# Patient Record
Sex: Male | Born: 1935 | Race: White | Hispanic: No | Marital: Single | Smoking: Never smoker
Health system: Southern US, Community
[De-identification: ages and names within clinical notes are randomized; demographics above are authoritative.]

## PROBLEM LIST (undated history)

## (undated) DIAGNOSIS — C4491 Basal cell carcinoma of skin, unspecified: Secondary | ICD-10-CM

## (undated) DIAGNOSIS — D649 Anemia, unspecified: Secondary | ICD-10-CM

## (undated) DIAGNOSIS — M47816 Spondylosis without myelopathy or radiculopathy, lumbar region: Secondary | ICD-10-CM

## (undated) DIAGNOSIS — E785 Hyperlipidemia, unspecified: Secondary | ICD-10-CM

## (undated) DIAGNOSIS — I10 Essential (primary) hypertension: Secondary | ICD-10-CM

## (undated) DIAGNOSIS — Z87891 Personal history of nicotine dependence: Secondary | ICD-10-CM

## (undated) HISTORY — PX: AORTIC VALVE REPLACEMENT: SHX41

---

## 2018-05-31 ENCOUNTER — Encounter (HOSPITAL_COMMUNITY): Payer: Self-pay

## 2018-05-31 ENCOUNTER — Inpatient Hospital Stay (HOSPITAL_COMMUNITY)
Admission: EM | Admit: 2018-05-31 | Discharge: 2018-06-02 | DRG: 178 | Disposition: A | Discharge status: Home Health | Payer: Medicare Other | Source: Skilled Nursing Facility | Attending: Internal Medicine | Admitting: Internal Medicine

## 2018-05-31 ENCOUNTER — Other Ambulatory Visit: Payer: Self-pay

## 2018-05-31 ENCOUNTER — Emergency Department (HOSPITAL_COMMUNITY): Payer: Medicare Other

## 2018-05-31 DIAGNOSIS — Z791 Long term (current) use of non-steroidal anti-inflammatories (NSAID): Secondary | ICD-10-CM

## 2018-05-31 DIAGNOSIS — Z952 Presence of prosthetic heart valve: Secondary | ICD-10-CM

## 2018-05-31 DIAGNOSIS — Z8249 Family history of ischemic heart disease and other diseases of the circulatory system: Secondary | ICD-10-CM

## 2018-05-31 DIAGNOSIS — Z79899 Other long term (current) drug therapy: Secondary | ICD-10-CM

## 2018-05-31 DIAGNOSIS — J690 Pneumonitis due to inhalation of food and vomit: Secondary | ICD-10-CM | POA: Diagnosis not present

## 2018-05-31 DIAGNOSIS — K5900 Constipation, unspecified: Secondary | ICD-10-CM

## 2018-05-31 DIAGNOSIS — F1020 Alcohol dependence, uncomplicated: Secondary | ICD-10-CM | POA: Diagnosis present

## 2018-05-31 DIAGNOSIS — Z7952 Long term (current) use of systemic steroids: Secondary | ICD-10-CM

## 2018-05-31 DIAGNOSIS — I959 Hypotension, unspecified: Secondary | ICD-10-CM | POA: Diagnosis present

## 2018-05-31 DIAGNOSIS — I10 Essential (primary) hypertension: Secondary | ICD-10-CM

## 2018-05-31 DIAGNOSIS — I440 Atrioventricular block, first degree: Secondary | ICD-10-CM

## 2018-05-31 DIAGNOSIS — E785 Hyperlipidemia, unspecified: Secondary | ICD-10-CM

## 2018-05-31 DIAGNOSIS — Z66 Do not resuscitate: Secondary | ICD-10-CM

## 2018-05-31 DIAGNOSIS — K219 Gastro-esophageal reflux disease without esophagitis: Secondary | ICD-10-CM

## 2018-05-31 DIAGNOSIS — K921 Melena: Secondary | ICD-10-CM | POA: Diagnosis not present

## 2018-05-31 DIAGNOSIS — Z87891 Personal history of nicotine dependence: Secondary | ICD-10-CM

## 2018-05-31 DIAGNOSIS — Z7289 Other problems related to lifestyle: Secondary | ICD-10-CM

## 2018-05-31 DIAGNOSIS — K2970 Gastritis, unspecified, without bleeding: Secondary | ICD-10-CM | POA: Diagnosis present

## 2018-05-31 DIAGNOSIS — J189 Pneumonia, unspecified organism: Secondary | ICD-10-CM | POA: Diagnosis present

## 2018-05-31 DIAGNOSIS — J181 Lobar pneumonia, unspecified organism: Secondary | ICD-10-CM | POA: Diagnosis not present

## 2018-05-31 DIAGNOSIS — D649 Anemia, unspecified: Secondary | ICD-10-CM

## 2018-05-31 DIAGNOSIS — Z7982 Long term (current) use of aspirin: Secondary | ICD-10-CM

## 2018-05-31 HISTORY — DX: Personal history of nicotine dependence: Z87.891

## 2018-05-31 LAB — BASIC METABOLIC PANEL
Anion gap: 9 (ref 5–15)
BUN: 40 mg/dL — ABNORMAL HIGH (ref 8–23)
CHLORIDE: 109 mmol/L (ref 98–111)
CO2: 20 mmol/L — AB (ref 22–32)
CREATININE: 0.86 mg/dL (ref 0.61–1.24)
Calcium: 7.2 mg/dL — ABNORMAL LOW (ref 8.9–10.3)
GFR calc non Af Amer: >60 mL/min (ref >60)
Glucose, Bld: 98 mg/dL (ref 70–99)
Potassium: 3.9 mmol/L (ref 3.5–5.1)
Sodium: 138 mmol/L (ref 135–145)

## 2018-05-31 LAB — CBC WITH DIFFERENTIAL/PLATELET
ABS IMMATURE GRANULOCYTES: 0.1 10*3/uL (ref 0.0–0.1)
Basophils Absolute: 0 10*3/uL (ref 0.0–0.1)
Basophils Relative: 0 %
Eosinophils Absolute: 0.1 10*3/uL (ref 0.0–0.7)
Eosinophils Relative: 1 %
HEMATOCRIT: 37.6 % — AB (ref 39.0–52.0)
HEMOGLOBIN: 12.6 g/dL — AB (ref 13.0–17.0)
IMMATURE GRANULOCYTES: 1 %
LYMPHS ABS: 1.4 10*3/uL (ref 0.7–4.0)
Lymphocytes Relative: 12 %
MCH: 32.4 pg (ref 26.0–34.0)
MCHC: 33.5 g/dL (ref 30.0–36.0)
MCV: 96.7 fL (ref 78.0–100.0)
MONOS PCT: 13 %
Monocytes Absolute: 1.5 10*3/uL — ABNORMAL HIGH (ref 0.1–1.0)
NEUTROS PCT: 73 %
Neutro Abs: 8.5 10*3/uL — ABNORMAL HIGH (ref 1.7–7.7)
Platelets: 239 10*3/uL (ref 150–400)
RBC: 3.89 MIL/uL — ABNORMAL LOW (ref 4.22–5.81)
RDW: 12.7 % (ref 11.5–15.5)
WBC: 11.5 10*3/uL — ABNORMAL HIGH (ref 4.0–10.5)

## 2018-05-31 LAB — URINALYSIS, ROUTINE W REFLEX MICROSCOPIC
Bilirubin Urine: NEGATIVE
Glucose, UA: NEGATIVE mg/dL
Hgb urine dipstick: NEGATIVE
Ketones, ur: NEGATIVE mg/dL
LEUKOCYTES UA: NEGATIVE
NITRITE: NEGATIVE
PH: 5 (ref 5.0–8.0)
Protein, ur: NEGATIVE mg/dL
SPECIFIC GRAVITY, URINE: 1.023 (ref 1.005–1.030)

## 2018-05-31 LAB — I-STAT CG4 LACTIC ACID, ED: Lactic Acid, Venous: 1.74 mmol/L (ref 0.5–1.9)

## 2018-05-31 MED ORDER — POLYETHYLENE GLYCOL 3350 17 G PO PACK: 17.0000 g | PACK | Freq: Every day | ORAL | Status: DC | PRN

## 2018-05-31 MED ORDER — GUAIFENESIN ER 600 MG PO TB12
600.0000 mg | ORAL_TABLET | Freq: Two times a day (BID) | ORAL | Status: DC
  Administered 2018-05-31 – 2018-06-02 (×4): 600 mg via ORAL
  Filled 2018-05-31 (×4): qty 1

## 2018-05-31 MED ORDER — SODIUM CHLORIDE 0.9 % IV BOLUS (SEPSIS)
1000.0000 mL | Freq: Once | INTRAVENOUS | Status: AC
  Administered 2018-05-31: 1000 mL via INTRAVENOUS

## 2018-05-31 MED ORDER — VITAMIN B-1 100 MG PO TABS
100.0000 mg | ORAL_TABLET | Freq: Every day | ORAL | Status: DC
  Administered 2018-05-31 – 2018-06-02 (×3): 100 mg via ORAL
  Filled 2018-05-31 (×3): qty 1

## 2018-05-31 MED ORDER — FENOFIBRATE 160 MG PO TABS
160.0000 mg | ORAL_TABLET | Freq: Every day | ORAL | Status: DC
  Administered 2018-05-31 – 2018-06-02 (×3): 160 mg via ORAL
  Filled 2018-05-31 (×3): qty 1

## 2018-05-31 MED ORDER — SODIUM CHLORIDE 0.9 % IV SOLN
2.0000 g | INTRAVENOUS | Status: DC
  Administered 2018-05-31: 2 g via INTRAVENOUS
  Filled 2018-05-31: qty 20

## 2018-05-31 MED ORDER — ENOXAPARIN SODIUM 40 MG/0.4ML ~~LOC~~ SOLN: 40.0000 mg | SUBCUTANEOUS | Status: DC

## 2018-05-31 MED ORDER — ACETAMINOPHEN 325 MG PO TABS: 650.0000 mg | ORAL_TABLET | Freq: Four times a day (QID) | ORAL | Status: DC | PRN

## 2018-05-31 MED ORDER — SODIUM CHLORIDE 0.9 % IV BOLUS (SEPSIS)
1000.0000 mL | Freq: Once | INTRAVENOUS | Status: AC
Start: 2018-05-31 — End: 2018-05-31
  Administered 2018-05-31: 1000 mL via INTRAVENOUS

## 2018-05-31 MED ORDER — ACETAMINOPHEN 650 MG RE SUPP: 650.0000 mg | Freq: Four times a day (QID) | RECTAL | Status: DC | PRN

## 2018-05-31 MED ORDER — METHOCARBAMOL 500 MG PO TABS: 500.0000 mg | ORAL_TABLET | Freq: Two times a day (BID) | ORAL | Status: DC | PRN

## 2018-05-31 MED ORDER — SODIUM CHLORIDE 0.9 % IV SOLN
3.0000 g | Freq: Four times a day (QID) | INTRAVENOUS | Status: DC
  Administered 2018-05-31 – 2018-06-02 (×8): 3 g via INTRAVENOUS
  Filled 2018-05-31 (×10): qty 3

## 2018-05-31 MED ORDER — PANTOPRAZOLE SODIUM 40 MG PO TBEC
40.0000 mg | DELAYED_RELEASE_TABLET | Freq: Every day | ORAL | Status: DC
  Administered 2018-05-31 – 2018-06-02 (×3): 40 mg via ORAL
  Filled 2018-05-31 (×3): qty 1

## 2018-05-31 MED ORDER — ALBUTEROL SULFATE (2.5 MG/3ML) 0.083% IN NEBU: 2.5000 mg | INHALATION_SOLUTION | RESPIRATORY_TRACT | Status: DC | PRN

## 2018-05-31 MED ORDER — IPRATROPIUM-ALBUTEROL 0.5-2.5 (3) MG/3ML IN SOLN
3.0000 mL | Freq: Once | RESPIRATORY_TRACT | Status: AC
  Administered 2018-05-31: 3 mL via RESPIRATORY_TRACT
  Filled 2018-05-31: qty 3

## 2018-05-31 MED ORDER — SODIUM CHLORIDE 0.9 % IV SOLN
500.0000 mg | INTRAVENOUS | Status: DC
  Administered 2018-05-31 – 2018-06-01 (×2): 500 mg via INTRAVENOUS
  Filled 2018-05-31 (×3): qty 500

## 2018-05-31 MED ORDER — FAMOTIDINE IN NACL 20-0.9 MG/50ML-% IV SOLN
20.0000 mg | Freq: Two times a day (BID) | INTRAVENOUS | Status: DC
  Administered 2018-05-31 – 2018-06-01 (×3): 20 mg via INTRAVENOUS
  Filled 2018-05-31 (×3): qty 50

## 2018-05-31 NOTE — ED Notes (Signed)
Patient transported to X-ray 

## 2018-05-31 NOTE — H&P (Signed)
Date: 05/31/2018               Patient Name:  Johnny Drake MRN: [id number]  DOB: [birth year] Age / Sex: 84 y.o., male   PCP: Linus Mako, NP         Medical Service: Internal Medicine Teaching Service         Attending Physician: Dr. Bartholomew Crews, MD    First Contact: Dr. Eileen Stanford Pager: [phone]  Second Contact: Dr. Jari Favre Pager: [phone]       After Hours (After 5p/  First Contact Pager: [phone]  weekends / holidays): Second Contact Pager: [phone]   Chief Complaint: Weakness, Cough   History of Present Illness:   Mr. Johnny Drake is an 84 year old Caucasian male with past medical history significant for GERD, hypertension, anemia, aortic valve replacement and hyperlipidemia who presented to the emergency department via EMS with weakness and cough.  He is a resident of an assisted living facility [facility]) and  was in his usual state of health until about 1 week ago when he visited his PCP (Dr. Earnest Bailey) for six-month follow-up.  At that visit, he was found to have abnormal auscultation on lung exams and was encouraged to get a checks x-ray.  He states that his chest x-ray revealed " bilateral pneumonia" and was started on doxycycline and a breathing treatment.  In the interim, he had been experiencing cough productive of yellow sputum but denied fevers, chills or shortness of breath.  3 days ago, his symptoms were not improving so patient called his clinic and was asked to follow-up this morning.  This morning as patient was walking to the clinic, he felt weak, off-balance and required about 50 minutes to get to the clinic but it usually takes him 10 minutes.  He did not endorse lightheadedness, dizziness, palpitation, dysrhythmia or shortness of breath.  While at the clinic, EMS was called and patient was subsequently brought to the ED.  He also complains of a 1 day history of severe heartburn as he has not received his antacid it for about a week.  This has since  resolved.  He reports of intermittent gagging for the past 3 to 4 days but denies vomiting, aspiration or dysphagia.  Patient also reports of a one-time episode of melena this morning and denies any previous episodes. He also denies hematochezia, hematemesis, bright red blood per rectum but endorses constipation.  ED course. EMS vitals BP 98/50, HR 110, CBG 97.  Received 500 bolus NS and Zofran 4 mg. On arrival to [facility], ED BP was stable, tachypneic with range 14-23, ECG pulse unremarkable, oxygen/saturation 99% on room air.  CBC showed mild leukocytosis of 11.5, hemoglobin of 12.6, normal MCV, BUN of 40 and normal lactic acid.  He received 3 L NS bolus and was started on ceftriaxone and azithromycin.  Meds:  Current Meds  Medication Sig  . albuterol (PROVENTIL HFA;VENTOLIN HFA) 108 (90 Base) MCG/ACT inhaler Inhale 2 puffs into the lungs every 6 (six) hours as needed for wheezing or shortness of breath.  Marland Kitchen aspirin EC 81 MG tablet Take 81 mg by mouth daily.  . celecoxib (CELEBREX) 200 MG capsule Take 200 mg by mouth daily.  Marland Kitchen doxycycline (VIBRAMYCIN) 100 MG capsule Take 100 mg by mouth 2 (two) times daily. For 10 days  . fenofibrate (TRICOR) 145 MG tablet Take 145 mg by mouth daily.  . folic acid (FOLVITE) 1 MG tablet Take 1 mg by mouth daily.  Marland Kitchen  guaiFENesin (MUCINEX) 600 MG 12 hr tablet Take 600 mg by mouth every 12 (twelve) hours. For 7 days  . hydrochlorothiazide (HYDRODIURIL) 12.5 MG tablet Take 12.5 mg by mouth daily.  Marland Kitchen losartan (COZAAR) 100 MG tablet Take 100 mg by mouth daily.  . methocarbamol (ROBAXIN) 500 MG tablet Take 500 mg by mouth 2 (two) times daily as needed for muscle spasms.  . metoprolol succinate (TOPROL-XL) 25 MG 24 hr tablet Take 25 mg by mouth every other day.  . predniSONE (DELTASONE) 20 MG tablet Take 20 mg by mouth See admin instructions. Take 40mg  for 3 days then 20mg  for 4 days  . thiamine 100 MG tablet Take 100 mg by mouth daily.     Allergies: Allergies  as of 05/31/2018  . (No Known Allergies)   No past medical history on file.  Family History:  Mother: Deceased at 67 from alcohol poisoning Father: Hypertension  Social History:  -Use tobacco products from age [age], smoked 1/2 pack/day -Current alcohol use.  Drinks a glass (2 shots) vodka each night -States he has been active and healthy most of his life and was a Insurance underwriter for the [employer] -Has completed in triathlons, marathons and has a competition coming up next month.  Review of Systems: A complete ROS was negative except as per HPI.   Physical Exam: Blood pressure 137/88, pulse 70, temperature 98 F (36.7 C), temperature source Oral, resp. rate 17, height 5\' 8"  (1.727 m), weight 185 lb (83.9 kg), SpO2 97 %.  Physical exam Constitutional: In no acute distress, pleasantly lying in bed, appears her stated age 63: Atraumatic, normocephalic Eyes: nonicteric Cardiovascular: Normal S1, S2.  No abnormal heart sounds appreciated Respiratory: CTA BL with the exception of crackles appreciated at the left lower lobe posteriorly and bilateral apices anteriorly  Abdomen: Soft, distended, nontender to palpation Extremities: Dorsalis pedis palpable, no lower extremity edema Neuro: alert and oriented Psychiatry: Mood and affect normal Skin: Well-healed surgical scar on the right upper chest.   EKG: personally reviewed my interpretation is sinus rhythm, low voltage.   CXR: personally reviewed my interpretation is bilateral apical consolidation.   Assessment & Plan by Problem: Active Problems:   Pneumonia  Mr. Johnny Drake is an 84 year old Caucasian male with past medical history significant for GERD, hypertension, anemia, aortic valve replacement, hyperlipidemia and EtOH use currently being treated for pneumonia.  Pneumonia Aspiration pneumonia vs community-acquired pneumonia 1 week history of cough productive of yellow sputum and previously started on doxycycline by her PCP.   Has remained afebrile since onset of symptoms.  Presented to ED with weakness, hypotension. CBC positive for mild leukocytosis of 11.5, lactic acid normal at 1.74, chest x-ray positive for bilateral apical consolidation. Physical exams noticeable for crackles at the left lower lobe posteriorly and bilateral apices anteriorly.  Patient resides at an assisted living facility which puts him at risk for community acquired pneumonia.  Also given his history of EtOH use and recent episodes of gagging, he is at risk for aspiration pneumonia. -Discontinue Zosyn -Start Unasyn 3g -Continue Azithromycin 500mg  -F/u AM CBC  -F/u blood culture -On Mucinex, DuoNeb, albuterol nebulizer   Upper GI bleed, asymptomatic  Gastritis vs peptic ulcer disease Anemia, asymptomatic One episode of melena this morning. Chronically on NSAIDs (Celocoxib) and likely to develop gastritis or peptic ulcer disease.  No signs of anemic symptomology such as lightheadedness, dizziness, chest pain, palpitation.  Hemoglobin on admission 12.6 -F/u morning H/H -F/u FOBT -Hold ASA -Hold Celecoxib  GERD Reports that he has been out of antacid for 1 week and last night experienced severe heartburn.  Currently asymptomatic -Start IV Pepcid 20 mg    Hypertension BP currently stable -Will hold home antihypertensives  -Losartan 100 mg daily, metoprolol 25 mg every other day, hydrochlorothiazide 12.5 mg daily   Hyperlipidemia -Takes fenofibrate 145 mg p.o. daily   ETOH Use  Drinks a glass of vodka each night.  No previous history of EtOH withdrawal or seizures -Start Thiamine 100 mg daily   FEN: Replace lytes as needed, thin liquid diet DVT prophylaxis: Lovenox 40 mg SQ CODE STATUS: DNR   Dispo: Admit patient to observation with expected length of stay less than 2 nights   Signed: Jean Rosenthal, MD 05/31/2018, 7:08 PM  Pager: [phone]

## 2018-05-31 NOTE — ED Provider Notes (Signed)
[facility] EMERGENCY DEPARTMENT Provider Note   CSN: [id number] Arrival date & time: 05/31/18  1433     History   Chief Complaint Chief Complaint  Patient presents with  . Shortness of Breath  . Weakness  . Hypotension    HPI Johnny Drake is a 84 y.o. male with a history of GERD, HTN, anemia, aortic valve replacement, and HLD who presents with weakness.   Patient reports that he was seen 1 week ago by the doctors at the [facility] for a routine 6 month follow-up. He states that during that visit the doctor noticed some abnormal chest findings on physical exam and ordered a CXR that ultimately showed a "bilateral pneumonia." He says that during that time he did have some cough and congestion but no SOB. He was started on doxycycline and albuterol. He states that his cough and congestion remained constant throughout the week.  Last night he had severe centrally-located non-radiating chest pain that he associated with his acid reflux. He states that due to an error at the assisted living facility he was unable to get his antacid medication. His chest pain improved throughout the night and into this morning. He decided to be seen at the clinic to get a refill of his antacid medication. He reports generalized weakness while walking to his clinic appointment stating "it took me 50 minutes to walk to the clinic when it usually only takes me 10 minutes." He says that he had to hold on to the guard rails to stay up and that when he finally got to the clinic he needed to sit down immediately. The clinic staff called EMS and he was noted to have VS 98/50, HR 110, CBG 97. EMS gave given 500 bolus and 4mg  Zofran.  Patient does not currently endorse SOB or chest pain. Patient does continue to endorse cough and congestion. He denies abdominal pain and fever. He does note several days of urgency but denies dysuria and hematuria.    No past medical history on  file.  Patient Active Problem List   Diagnosis Date Noted  . Pneumonia 05/31/2018       Home Medications    Prior to Admission medications   Medication Sig Start Date End Date Taking? Authorizing Provider  albuterol (PROVENTIL HFA;VENTOLIN HFA) 108 (90 Base) MCG/ACT inhaler Inhale 2 puffs into the lungs every 6 (six) hours as needed for wheezing or shortness of breath.   Yes [provider]  aspirin EC 81 MG tablet Take 81 mg by mouth daily.   Yes [provider]  celecoxib (CELEBREX) 200 MG capsule Take 200 mg by mouth daily.   Yes [provider]  doxycycline (VIBRAMYCIN) 100 MG capsule Take 100 mg by mouth 2 (two) times daily. For 10 days   Yes [provider]  fenofibrate (TRICOR) 145 MG tablet Take 145 mg by mouth daily.   Yes [provider]  folic acid (FOLVITE) 1 MG tablet Take 1 mg by mouth daily.   Yes [provider]  guaiFENesin (MUCINEX) 600 MG 12 hr tablet Take 600 mg by mouth every 12 (twelve) hours. For 7 days   Yes [provider]  hydrochlorothiazide (HYDRODIURIL) 12.5 MG tablet Take 12.5 mg by mouth daily.   Yes [provider]  losartan (COZAAR) 100 MG tablet Take 100 mg by mouth daily.   Yes [provider]  methocarbamol (ROBAXIN) 500 MG tablet Take 500 mg by mouth 2 (two) times  daily as needed for muscle spasms.   Yes [provider]  metoprolol succinate (TOPROL-XL) 25 MG 24 hr tablet Take 25 mg by mouth every other day.   Yes [provider]  predniSONE (DELTASONE) 20 MG tablet Take 20 mg by mouth See admin instructions. Take 40mg  for 3 days then 20mg  for 4 days   Yes [provider]  thiamine 100 MG tablet Take 100 mg by mouth daily.   Yes [provider]    Family History No family history on file.  Social History Social History   Tobacco Use  . Smoking status: Not on file  Substance Use Topics  . Alcohol use: Not on file  . Drug use:  Not on file     Allergies   Patient has no allergy information on record.   Review of Systems Review of Systems  Constitutional: Negative for chills, diaphoresis and fever.  HENT: Positive for congestion. Negative for rhinorrhea and sore throat.   Eyes: Negative.   Respiratory: Positive for cough and shortness of breath. Negative for chest tightness and wheezing.   Cardiovascular: Positive for chest pain. Negative for palpitations and leg swelling.  Gastrointestinal: Negative for abdominal distention, abdominal pain, constipation, diarrhea, nausea and vomiting.  Genitourinary: Positive for urgency. Negative for difficulty urinating and hematuria.  Neurological: Positive for weakness. Negative for dizziness, light-headedness, numbness and headaches.  Psychiatric/Behavioral: Negative.   All other systems reviewed and are negative.    Physical Exam Updated Vital Signs BP (!) 124/57 (BP Location: Left Arm)   Pulse 61   Temp 97.6 F (36.4 C) (Oral)   Resp (!) 28   Ht 5\' 8"  (1.727 m)   Wt 83.9 kg (185 lb)   SpO2 100%   BMI 28.13 kg/m   Physical Exam  Constitutional: He appears well-developed and well-nourished.  HENT:  Head: Normocephalic and atraumatic.  Eyes: EOM are normal.  Cardiovascular: Normal rate and regular rhythm.  Pulmonary/Chest: Effort normal.  Crackles at lung bases bilaterally.  Abdominal: Soft. Bowel sounds are normal.  Musculoskeletal:       Right lower leg: Normal.       Left lower leg: Normal.  Neurological: He is alert.  Skin: Skin is warm and dry.  Psychiatric: He has a normal mood and affect. His behavior is normal.  Nursing note and vitals reviewed.    ED Treatments / Results  Labs (all labs ordered are listed, but only abnormal results are displayed) Labs Reviewed  BASIC METABOLIC PANEL - Abnormal; Notable for the following components:      Result Value   CO2 20 (*)    BUN 40 (*)    Calcium 7.2 (*)    All other components within  normal limits  CBC WITH DIFFERENTIAL/PLATELET - Abnormal; Notable for the following components:   WBC 11.5 (*)    RBC 3.89 (*)    Hemoglobin 12.6 (*)    HCT 37.6 (*)    Neutro Abs 8.5 (*)    Monocytes Absolute 1.5 (*)    All other components within normal limits  CULTURE, BLOOD (ROUTINE X 2)  CULTURE, BLOOD (ROUTINE X 2)  URINALYSIS, ROUTINE W REFLEX MICROSCOPIC  COMPREHENSIVE METABOLIC PANEL  CBC  OCCULT BLOOD X 1 CARD TO LAB, STOOL  I-STAT CG4 LACTIC ACID, ED    EKG EKG Interpretation  Date/Time:  Monday May 31 2018 14:46:57 EDT Ventricular Rate:  80 PR Interval:    QRS Duration: 82 QT Interval:  384 QTC Calculation:  443 R Axis:   1 Text Interpretation:  Sinus rhythm Prolonged PR interval Low voltage, precordial leads Probable anteroseptal infarct, old No old tracing to compare Confirmed by Isla Pence [phone]) on 05/31/2018 2:49:23 PM   Radiology Dg Chest 2 View  Result Date: 05/31/2018 CLINICAL DATA:  Shortness of breath, history of pneumonia EXAM: CHEST - 2 VIEW COMPARISON:  None. FINDINGS: Cardiac shadows within normal limits. Postsurgical changes are seen. Lungs are well aerated bilaterally. Patchy changes in the right apex are noted consistent with focal infiltrate. No sizable effusion is noted. No bony abnormality is seen. IMPRESSION: Increased right apical changes consistent with pneumonia. Electronically Signed   By: Inez Catalina M.D.   On: 05/31/2018 16:28    Procedures Procedures (including critical care time)  Medications Ordered in ED Medications  azithromycin (ZITHROMAX) 500 mg in sodium chloride 0.9 % 250 mL IVPB (0 mg Intravenous Stopped 05/31/18 1710)  fenofibrate tablet 160 mg (has no administration in time range)  guaiFENesin (MUCINEX) 12 hr tablet 600 mg (has no administration in time range)  methocarbamol (ROBAXIN) tablet 500 mg (has no administration in time range)  thiamine tablet 100 mg (has no administration in time range)  pantoprazole  (PROTONIX) EC tablet 40 mg (has no administration in time range)  enoxaparin (LOVENOX) injection 40 mg (has no administration in time range)  acetaminophen (TYLENOL) tablet 650 mg (has no administration in time range)    Or  acetaminophen (TYLENOL) suppository 650 mg (has no administration in time range)  polyethylene glycol (MIRALAX / GLYCOLAX) packet 17 g (has no administration in time range)  albuterol (PROVENTIL) (2.5 MG/3ML) 0.083% nebulizer solution 2.5 mg (has no administration in time range)  famotidine (PEPCID) IVPB 20 mg premix (has no administration in time range)  Ampicillin-Sulbactam (UNASYN) 3 g in sodium chloride 0.9 % 100 mL IVPB (has no administration in time range)  ipratropium-albuterol (DUONEB) 0.5-2.5 (3) MG/3ML nebulizer solution 3 mL (3 mLs Nebulization Given 05/31/18 1544)  sodium chloride 0.9 % bolus 1,000 mL (0 mLs Intravenous Stopped 05/31/18 1654)    And  sodium chloride 0.9 % bolus 1,000 mL (0 mLs Intravenous Stopped 05/31/18 1654)    And  sodium chloride 0.9 % bolus 1,000 mL (0 mLs Intravenous Stopped 05/31/18 1630)     Initial Impression / Assessment and Plan / ED Course  I have reviewed the triage vital signs and the nursing notes.  Pertinent labs & imaging results that were available during my care of the patient were reviewed by me and considered in my medical decision making (see chart for details).  Kadan Millstein is a 84 y.o. male with a history of GERD, HTN, anemia, aortic valve replacement, and HLD who presents with SOB and weakness. Patient's symptoms are most likely secondary to a Community Acquired Pneumonia given CXR findings findings of infiltrates at the right apex and leukocytosis (11.5). Less likely secondary to ACS given normal EKG with no acute ischemic changes and lack of chest pain currently. Patient was found to have azotemia (BUN: 40) and elevated BUN/creatinine of 46.51 suggesting volume depletion. Per EMS, patient was hypotensive 98/50  suggesting that the patient is septic (source: pneumonia) and that he has a pre-renal azotemia. Patient failed outpatient treatment with Doxycycline. Blood cultures were drawn and patient was started on ceftriaxone and azithromycin in the ED. He was given Duoneb's for SOB. Lactic acid was within normal limits and urinalysis was unremarkable. Given patient's age and failure on outpatient antibiotics, patient was admitted for further  management of community acquired pneumonia.    Final Clinical Impressions(s) / ED Diagnoses   Final diagnoses:  Community acquired pneumonia of right upper lobe of lung [facility])     ED Discharge Orders    None       Carroll Sage, MD 05/31/18 Lina Sayre, MD 06/01/18 Olga, Julie, MD 06/17/18 [id number]

## 2018-05-31 NOTE — ED Triage Notes (Addendum)
Pt here via [facility] from [facility]. Pt reports pneumonia  x 1 week that hasn't gotten any better.  Today he has a productive cough that is causing him to vomit. Pt reports seeing his PCP today at the facility and that the 10 minute walk there took him 50 minutes because he felt so weak and SOB.  EMS VS 98/50, HR 110, CBG 97, pt given 500 bolus and 4mg  zofran

## 2018-05-31 NOTE — Progress Notes (Deleted)
Date: 05/31/2018               Patient Name:  Johnny Drake MRN: [id number]  DOB: 1932/11/10 Age / Sex: 84 y.o., male   PCP: Linus Mako, NP         Medical Service: Internal Medicine Teaching Service         Attending Physician: Dr. Bartholomew Crews, MD    First Contact: Dr. Eileen Stanford Pager: [phone]  Second Contact: Dr. Jari Favre Pager: [phone]       After Hours (After 5p/  First Contact Pager: [phone]  weekends / holidays): Second Contact Pager: [phone]   Chief Complaint: Weakness, Cough   History of Present Illness:   Johnny Drake is an 84 year old Caucasian male with past medical history significant for GERD, hypertension, anemia, aortic valve replacement and hyperlipidemia who presented to the emergency department via EMS with weakness and cough.  He is a resident of an assisted living facility [facility]) and  was in his usual state of health until about 1 week ago when he visited his PCP (Dr. Earnest Bailey) for six-month follow-up.  At that visit, he was found to have abnormal auscultation on lung exams and was encouraged to get a checks x-ray.  He states that his chest x-ray revealed " bilateral pneumonia" and was started on doxycycline and a breathing treatment.  In the interim, he had been experiencing cough productive of yellow sputum but denied fevers, chills or shortness of breath.  3 days ago, his symptoms were not improving so patient called his clinic and was asked to follow-up this morning.  This morning as patient was walking to the clinic, he felt weak, off-balance and required about 50 minutes to get to the clinic but it usually takes him 10 minutes.  He did not endorse lightheadedness, dizziness, palpitation, dysrhythmia or shortness of breath.  While at the clinic, EMS was called and patient was subsequently brought to the ED.  He also complains of a 1 day history of severe heartburn as he has not received his antacid it for about a week.  This has since  resolved.  He reports of intermittent gagging for the past 3 to 4 days but denies vomiting, aspiration or dysphagia.  Patient also reports of a one-time episode of melena this morning and denies any previous episodes. He also denies hematochezia, hematemesis, bright red blood per rectum but endorses constipation.  ED course. EMS vitals BP 98/50, HR 110, CBG 97.  Received 500 bolus NS and Zofran 4 mg. On arrival to [facility], ED BP was stable, tachypneic with range 14-23, ECG pulse unremarkable, oxygen/saturation 99% on room air.  CBC showed mild leukocytosis of 11.5, hemoglobin of 12.6, normal MCV, BUN of 40 and normal lactic acid.  He received 3 L NS bolus and was started on ceftriaxone and azithromycin.  Meds:  Current Meds  Medication Sig  . albuterol (PROVENTIL HFA;VENTOLIN HFA) 108 (90 Base) MCG/ACT inhaler Inhale 2 puffs into the lungs every 6 (six) hours as needed for wheezing or shortness of breath.  Marland Kitchen aspirin EC 81 MG tablet Take 81 mg by mouth daily.  . celecoxib (CELEBREX) 200 MG capsule Take 200 mg by mouth daily.  Marland Kitchen doxycycline (VIBRAMYCIN) 100 MG capsule Take 100 mg by mouth 2 (two) times daily. For 10 days  . fenofibrate (TRICOR) 145 MG tablet Take 145 mg by mouth daily.  . folic acid (FOLVITE) 1 MG tablet Take 1 mg by mouth daily.  Marland Kitchen  guaiFENesin (MUCINEX) 600 MG 12 hr tablet Take 600 mg by mouth every 12 (twelve) hours. For 7 days  . hydrochlorothiazide (HYDRODIURIL) 12.5 MG tablet Take 12.5 mg by mouth daily.  Marland Kitchen losartan (COZAAR) 100 MG tablet Take 100 mg by mouth daily.  . methocarbamol (ROBAXIN) 500 MG tablet Take 500 mg by mouth 2 (two) times daily as needed for muscle spasms.  . metoprolol succinate (TOPROL-XL) 25 MG 24 hr tablet Take 25 mg by mouth every other day.  . predniSONE (DELTASONE) 20 MG tablet Take 20 mg by mouth See admin instructions. Take 40mg  for 3 days then 20mg  for 4 days  . thiamine 100 MG tablet Take 100 mg by mouth daily.     Allergies: Allergies  as of 05/31/2018  . (No Known Allergies)   No past medical history on file.  Family History:  Mother: Deceased at 42 from alcohol poisoning Father: Hypertension  Social History:  -Use tobacco products from age [age], smoked 1/2 pack/day -Current alcohol use.  Drinks a glass (2 shots) vodka each night -States he has been active and healthy most of his life and was a Insurance underwriter for the [employer] -Has completed in triathlons, marathons and has a competition coming up next month.  Review of Systems: A complete ROS was negative except as per HPI.   Physical Exam: Blood pressure 137/88, pulse 70, temperature 98 F (36.7 C), temperature source Oral, resp. rate 17, height 5\' 8"  (1.727 m), weight 185 lb (83.9 kg), SpO2 97 %.  Physical exam Constitutional: In no acute distress, pleasantly lying in bed, appears her stated age 20: Atraumatic, normocephalic Eyes: nonicteric Cardiovascular: Normal S1, S2.  No abnormal heart sounds appreciated Respiratory: CTA BL with the exception of crackles appreciated at the left lower lobe posteriorly and bilateral apices anteriorly  Abdomen: Soft, distended, nontender to palpation Extremities: Dorsalis pedis palpable, no lower extremity edema Neuro: alert and oriented Psychiatry: Mood and affect normal Skin: Well-healed surgical scar on the right upper chest.   EKG: personally reviewed my interpretation is sinus rhythm, low voltage.  CXR: personally reviewed my interpretation is bilateral apical consolidation.   Assessment & Plan by Problem: Active Problems:   Pneumonia  [name] is an 84 year old Caucasian male with past medical history significant for GERD, hypertension, anemia, aortic valve replacement, hyperlipidemia and EtOH use currently being treated for pneumonia.  Pneumonia Aspiration pneumonia vs community-acquired pneumonia 1 week history of cough productive of yellow sputum and previously started on doxycycline by her PCP.  Has  remained afebrile since onset of symptoms.  Presented to ED with weakness, hypotension. CBC positive for mild leukocytosis of 11.5, lactic acid normal at 1.74, chest x-ray positive for bilateral apical consolidation. Physical exams noticeable for crackles at the left lower lobe posteriorly and bilateral apices anteriorly.  Patient resides at an assisted living facility which puts him at risk for community acquired pneumonia.  Also given his history of EtOH use and recent episodes of gagging, he is at risk for aspiration pneumonia. -Discontinue Zosyn -Start Unasyn 3g -Continue Azithromycin 500mg  -F/u AM CBC  -F/u blood culture -On Mucinex, DuoNeb, albuterol nebulizer   Upper GI bleed, asymptomatic  Gastritis vs peptic ulcer disease Anemia, asymptomatic One episode of melena this morning. Chronically on NSAIDs (Celocoxib) and likely to develop gastritis or peptic ulcer disease.  No signs of anemic symptomology such as lightheadedness, dizziness, chest pain, palpitation.  Hemoglobin on admission 12.6 -F/u morning H/H -F/u FOBT -Hold ASA -Hold Celecoxib  GERD Reports that he has been out of antacid for 1 week and last night experienced severe heartburn.  Currently asymptomatic -Start IV Pepcid 20 mg    Hypertension BP currently stable -Will hold home antihypertensives    Hyperlipidemia -Takes fenofibrate 145 mg p.o. daily   ETOH Use  Drinks a glass of vodka each night.  No previous history of EtOH withdrawal or seizures -Start Thiamine 100 mg daily   FEN: Replace lytes as needed, thin liquid diet DVT prophylaxis: Lovenox 40 mg SQ CODE STATUS: DNR   Dispo: Admit patient to observation with expected length of stay less than 2 nights   Signed: Jean Rosenthal, MD 05/31/2018, 7:08 PM  Pager: [phone]

## 2018-06-01 DIAGNOSIS — K5900 Constipation, unspecified: Secondary | ICD-10-CM | POA: Diagnosis not present

## 2018-06-01 DIAGNOSIS — F1020 Alcohol dependence, uncomplicated: Secondary | ICD-10-CM | POA: Diagnosis present

## 2018-06-01 DIAGNOSIS — Z7982 Long term (current) use of aspirin: Secondary | ICD-10-CM | POA: Diagnosis not present

## 2018-06-01 DIAGNOSIS — J189 Pneumonia, unspecified organism: Secondary | ICD-10-CM | POA: Diagnosis present

## 2018-06-01 DIAGNOSIS — Z952 Presence of prosthetic heart valve: Secondary | ICD-10-CM | POA: Diagnosis not present

## 2018-06-01 DIAGNOSIS — J181 Lobar pneumonia, unspecified organism: Secondary | ICD-10-CM | POA: Diagnosis present

## 2018-06-01 DIAGNOSIS — J690 Pneumonitis due to inhalation of food and vomit: Secondary | ICD-10-CM | POA: Diagnosis present

## 2018-06-01 DIAGNOSIS — Z8249 Family history of ischemic heart disease and other diseases of the circulatory system: Secondary | ICD-10-CM | POA: Diagnosis not present

## 2018-06-01 DIAGNOSIS — Z7952 Long term (current) use of systemic steroids: Secondary | ICD-10-CM | POA: Diagnosis not present

## 2018-06-01 DIAGNOSIS — I10 Essential (primary) hypertension: Secondary | ICD-10-CM | POA: Diagnosis present

## 2018-06-01 DIAGNOSIS — E785 Hyperlipidemia, unspecified: Secondary | ICD-10-CM | POA: Diagnosis present

## 2018-06-01 DIAGNOSIS — I959 Hypotension, unspecified: Secondary | ICD-10-CM | POA: Diagnosis present

## 2018-06-01 DIAGNOSIS — Z87891 Personal history of nicotine dependence: Secondary | ICD-10-CM | POA: Diagnosis not present

## 2018-06-01 DIAGNOSIS — K921 Melena: Secondary | ICD-10-CM | POA: Diagnosis present

## 2018-06-01 DIAGNOSIS — Z79899 Other long term (current) drug therapy: Secondary | ICD-10-CM | POA: Diagnosis not present

## 2018-06-01 DIAGNOSIS — K219 Gastro-esophageal reflux disease without esophagitis: Secondary | ICD-10-CM | POA: Diagnosis present

## 2018-06-01 DIAGNOSIS — K2970 Gastritis, unspecified, without bleeding: Secondary | ICD-10-CM | POA: Diagnosis present

## 2018-06-01 DIAGNOSIS — Z791 Long term (current) use of non-steroidal anti-inflammatories (NSAID): Secondary | ICD-10-CM | POA: Diagnosis not present

## 2018-06-01 DIAGNOSIS — D649 Anemia, unspecified: Secondary | ICD-10-CM | POA: Diagnosis present

## 2018-06-01 DIAGNOSIS — Z66 Do not resuscitate: Secondary | ICD-10-CM | POA: Diagnosis present

## 2018-06-01 LAB — BLOOD CULTURE ID PANEL (REFLEXED)
ACINETOBACTER BAUMANNII: NOT DETECTED
CANDIDA KRUSEI: NOT DETECTED
Candida albicans: NOT DETECTED
Candida glabrata: NOT DETECTED
Candida parapsilosis: NOT DETECTED
Candida tropicalis: NOT DETECTED
ENTEROCOCCUS SPECIES: NOT DETECTED
ESCHERICHIA COLI: NOT DETECTED
Enterobacter cloacae complex: NOT DETECTED
Enterobacteriaceae species: NOT DETECTED
Haemophilus influenzae: NOT DETECTED
Klebsiella oxytoca: NOT DETECTED
Klebsiella pneumoniae: NOT DETECTED
Listeria monocytogenes: NOT DETECTED
METHICILLIN RESISTANCE: NOT DETECTED
Neisseria meningitidis: NOT DETECTED
PSEUDOMONAS AERUGINOSA: NOT DETECTED
Proteus species: NOT DETECTED
SERRATIA MARCESCENS: NOT DETECTED
STAPHYLOCOCCUS AUREUS BCID: NOT DETECTED
STAPHYLOCOCCUS SPECIES: DETECTED — AB
STREPTOCOCCUS PNEUMONIAE: NOT DETECTED
STREPTOCOCCUS SPECIES: NOT DETECTED
Streptococcus agalactiae: NOT DETECTED
Streptococcus pyogenes: NOT DETECTED

## 2018-06-01 LAB — CBC
HCT: 31.3 % — ABNORMAL LOW (ref 39.0–52.0)
HEMOGLOBIN: 10.5 g/dL — AB (ref 13.0–17.0)
MCH: 32.4 pg (ref 26.0–34.0)
MCHC: 33.5 g/dL (ref 30.0–36.0)
MCV: 96.6 fL (ref 78.0–100.0)
Platelets: 123 10*3/uL — ABNORMAL LOW (ref 150–400)
RBC: 3.24 MIL/uL — ABNORMAL LOW (ref 4.22–5.81)
RDW: 12.3 % (ref 11.5–15.5)
WBC: 6.4 10*3/uL (ref 4.0–10.5)

## 2018-06-01 LAB — COMPREHENSIVE METABOLIC PANEL
ALT: 23 U/L (ref 0–44)
AST: 41 U/L (ref 15–41)
Albumin: 2.4 g/dL — ABNORMAL LOW (ref 3.5–5.0)
Alkaline Phosphatase: 28 U/L — ABNORMAL LOW (ref 38–126)
Anion gap: 7 (ref 5–15)
BUN: 32 mg/dL — AB (ref 8–23)
CO2: 19 mmol/L — ABNORMAL LOW (ref 22–32)
Calcium: 7.4 mg/dL — ABNORMAL LOW (ref 8.9–10.3)
Chloride: 111 mmol/L (ref 98–111)
Creatinine, Ser: 0.87 mg/dL (ref 0.61–1.24)
GFR calc Af Amer: >60 mL/min (ref >60)
GFR calc non Af Amer: >60 mL/min (ref >60)
Glucose, Bld: 81 mg/dL (ref 70–99)
Potassium: 4.8 mmol/L (ref 3.5–5.1)
Sodium: 137 mmol/L (ref 135–145)
Total Bilirubin: 1.2 mg/dL (ref 0.3–1.2)
Total Protein: 4.8 g/dL — ABNORMAL LOW (ref 6.5–8.1)

## 2018-06-01 NOTE — Progress Notes (Signed)
Date: 06/01/2018  Patient name: Johnny Drake  Medical record number: [id number]  Date of birth: February 03, 1932   I have seen and evaluated Johnny Drake and discussed their care with the Residency Team. Johnny Drake is an interdependent community dwelling 84 yo man with remote h/o tobacco use who has been treated as an outpatient for a community-acquired pneumonia, diagnosed by auscultation and chest x-ray, with doxycycline by his PCP.  His symptoms did not improve but worsened and he presented to the ED.  His symptoms have included productive cough, fever, chills, shortness of breath, generalized weakness, and off balance.  Of note, he did not receive his omeprazole via mail order pharmacy and has been off of it for about 3 weeks with resultant heartburn and gagging.  This morning, he feels much better and is asking when he is able to go home.  His appetite has not yet returned to normal and he has not yet been out of bed.  PMHx, Fam Hx, and/or Soc Hx : Retired Johnny Drake. Married. Lives in assisted living. Participates in senior Olympics.   Vitals:   06/01/18 0655 06/01/18 0813  BP: 138/74 (!) 114/51  Pulse: 66 79  Resp:  (!) 24  Temp: 98.9 F (37.2 C) 98.3 F (36.8 C)  SpO2: 97% 100%  NAD,mlying comfortably in bed. No accessory muscle usage. No resp distress. Speaking in full sentences.  HRRR no MRG L good air flow, L base rales ABD + BS Skin warm and dry, no rashes  LA 1.74 WBC 11.5 - 6.4 HgB 12.6 - 10.5 Plts 239 - 123 UA no abnl Blood cx GPC in anaerobic bottle only  I personally viewed the CXR images and confirmed my reading with the official read. 2 view, PA and lateral, sternal wires, slight rotation, patchy infiltrates primarily in RUL  I personally viewed the EKG and confirmed my reading with the official read. Sinus, PAC's, 1st degree block, no ischemic changes  Assessment and Plan: I have seen and evaluated the patient as outlined above. I agree with the formulated  Assessment and Plan as detailed in the residents' note, with the following changes:  Johnny Drake is an interdependent community dwelling 84 yo man with remote h/o tobacco use who failed outpatient treatment for a community-acquired pneumonia with doxycycline.  He presented to the emergency department and was slightly hypotensive and tachycardic.  He never had a fever or a leukocytosis.  He has received 3-1/2 L of IV fluids for resuscitation and is on a azithromycin and Unasyn for anaerobic coverage.  This most likely is a community-acquired pneumonia but we cannot rule out an aspiration since he has had some symptoms of gagging over the past few days.  His anaerobic blood culture is positive for gram-positive cocci.  He has made a significant improvement since admission but since he failed outpatient oral antibiotic treatment, we will observe him for another day or 2 to ensure continued resolution of his symptoms.  1.  Community-acquired pneumonia versus aspiration pneumonia -he is responding well to azithromycin and Unasyn.  We will follow-up his blood culture results.  Final antibiotic choice for outpatient oral therapy to be determined.  2.  GERD -patient has a long history of symptomatic GERD and it has flared since he has been off of his PPI for the past few weeks.  We have resumed this.  He describes one episode of melena as an outpatient.  He has not had any further signs of GI bleeding and his hemoglobin  did drop but was likely secondary to volume resuscitation.  We will continue to follow his hemoglobin, continue his PPI, and consult GI only if he has further signs or symptoms of GI bleeding.  Bartholomew Crews, MD 7/23/201912:06 PM

## 2018-06-01 NOTE — Progress Notes (Signed)
Initial Nutrition Assessment  DOCUMENTATION CODES:   Not applicable  INTERVENTION:    Continue Heart Healthy diet  NUTRITION DIAGNOSIS:   Inadequate oral intake related to poor appetite as evidenced by per patient/family report  GOAL:   Patient will meet greater than or equal to 90% of their needs  MONITOR:   PO intake, Labs, Weight trends, Skin, I & O's  REASON FOR ASSESSMENT:   Malnutrition Screening Tool  ASSESSMENT:   84 yo Male with PMH significant for GERD, hypertension, anemia, aortic valve replacement and hyperlipidemia who presented to the ED via EMS with weakness and cough.  Pt admitted with Community acquired pneumonia of right upper lobe of lung ([place]) [J18.1]   RD spoke with pt at bedside. He is annoyed with his IV pump beeping. Pt reports he's not hungry, however, doesn't know why. Didn't eat his breakfast because it was "ice cold".  Pt typically consumes breakfast, a snack and dinner at home, He also endorses a 12-13 lb weight loss in the last 10-15 days. Labs and medications reviewed.  Pt declined addition of any oral nutrition supplements.  NUTRITION - FOCUSED PHYSICAL EXAM:  Completed. No muscle or fat depletions noticed.  Diet Order:   Diet Order           Diet Heart Room service appropriate? Yes; Fluid consistency: Thin  Diet effective now         EDUCATION NEEDS:   No education needs have been identified at this time  Skin:  Skin Assessment: Reviewed RN Assessment  Last BM:  N/A  Height:   Ht Readings from Last 1 Encounters:  05/31/18 5\' 8"  (1.727 m)   Weight:   Wt Readings from Last 1 Encounters:  05/31/18 185 lb (83.9 kg)   BMI:  Body mass index is 28.13 kg/m.  Estimated Nutritional Needs:   Kcal:  1800-2000  Protein:  90-105 gm  Fluid:  1.8-2.0 L  Arthur Holms, RD, LDN Pager #: [phone] After-Hours Pager #: [phone]

## 2018-06-01 NOTE — Evaluation (Signed)
Physical Therapy Evaluation Patient Details Name: Johnny Drake MRN: [id number] DOB: November 26, 1931 Today's Date: 06/01/2018   History of Present Illness  Pt is 84 yo male who was being treated for CAP and his symptoms worsened bringing him to ED. They included productive cough, fever, chills, shortness of breath, generalized weakness, and off balance. He has also had worsening of GERD symptoms and has been off his meds for this for several weeks.   Clinical Impression  Pt admitted with above diagnosis. Pt currently with functional limitations due to the deficits listed below (see PT Problem List). Pt mildly off balance with ambulation and with increased WOB, 2/4 DOE with 300'. Will check back on him if he does not d/c tomorrow, other wise follow up with HHPT.  Pt will benefit from skilled PT to increase their independence and safety with mobility to allow discharge to the venue listed below.       Follow Up Recommendations Home health PT    Equipment Recommendations  None recommended by PT    Recommendations for Other Services       Precautions / Restrictions Precautions Precautions: Fall Precaution Comments: came in with balance issues Restrictions Weight Bearing Restrictions: No      Mobility  Bed Mobility               General bed mobility comments: pt received in bathroom  Transfers Overall transfer level: Needs assistance Equipment used: None Transfers: Sit to/from Stand Sit to Stand: Supervision         General transfer comment: supervision for safety but pt with no LOB without use of AD  Ambulation/Gait Ambulation/Gait assistance: Min guard Gait Distance (Feet): 300 Feet Assistive device: None Gait Pattern/deviations: Step-through pattern Gait velocity: decreased Gait velocity interpretation: 1.31 - 2.62 ft/sec, indicative of limited community ambulator General Gait Details: increased sway with ambulation and pt reports he still feels a little off balance  though no overt LOB. Pt with 2/4 DOE during ambulation, noticeable when he speaks. He reports this is not his baseline. O2 sats 98% on RA  Stairs            Wheelchair Mobility    Modified Rankin (Stroke Patients Only)       Balance Overall balance assessment: Mild deficits observed, not formally tested                                           Pertinent Vitals/Pain Pain Assessment: No/denies pain    Home Living Family/patient expects to be discharged to:: Private residence Living Arrangements: Spouse/significant other Available Help at Discharge: Family;Available 24 hours/day Type of Home: Independent living facility Home Access: Level entry     Home Layout: One level Home Equipment: Walker - 2 wheels Additional Comments: pt's wife ambulates with RW and he is very worried about her. His living situation is 3 level care.     Prior Function Level of Independence: Independent         Comments: drives, participates in senior olympics for swimming     Hand Dominance        Extremity/Trunk Assessment   Upper Extremity Assessment Upper Extremity Assessment: Overall WFL for tasks assessed    Lower Extremity Assessment Lower Extremity Assessment: Overall WFL for tasks assessed    Cervical / Trunk Assessment Cervical / Trunk Assessment: Kyphotic  Communication   Communication: No difficulties  Cognition  Arousal/Alertness: Awake/alert Behavior During Therapy: WFL for tasks assessed/performed Overall Cognitive Status: Within Functional Limits for tasks assessed                                        General Comments      Exercises     Assessment/Plan    PT Assessment Patient needs continued PT services  PT Problem List Decreased activity tolerance;Cardiopulmonary status limiting activity;Decreased balance       PT Treatment Interventions Gait training;Functional mobility training;Therapeutic  activities;Therapeutic exercise;Balance training;Patient/family education    PT Goals (Current goals can be found in the Care Plan section)  Acute Rehab PT Goals Patient Stated Goal: return home with wife PT Goal Formulation: With patient Time For Goal Achievement: 06/15/18 Potential to Achieve Goals: Good    Frequency Min 3X/week   Barriers to discharge        Co-evaluation               AM-PAC PT "6 Clicks" Daily Activity  Outcome Measure Difficulty turning over in bed (including adjusting bedclothes, sheets and blankets)?: None Difficulty moving from lying on back to sitting on the side of the bed? : None Difficulty sitting down on and standing up from a chair with arms (e.g., wheelchair, bedside commode, etc,.)?: A Little Help needed moving to and from a bed to chair (including a wheelchair)?: A Little Help needed walking in hospital room?: A Little Help needed climbing 3-5 steps with a railing? : A Little 6 Click Score: 20    End of Session Equipment Utilized During Treatment: Gait belt Activity Tolerance: Patient tolerated treatment well Patient left: in chair;with call bell/phone within reach;with family/visitor present Nurse Communication: Mobility status PT Visit Diagnosis: Unsteadiness on feet (R26.81)    Time: 6314-9702 PT Time Calculation (min) (ACUTE ONLY): 18 min   Charges:   PT Evaluation $PT Eval Moderate Complexity: 1 Mod     PT G Codes:        Leighton Roach, PT  Acute Rehab Services  [place] 06/01/2018, 4:53 PM

## 2018-06-01 NOTE — Progress Notes (Signed)
PHARMACY - PHYSICIAN COMMUNICATION CRITICAL VALUE ALERT - BLOOD CULTURE IDENTIFICATION (BCID)  Johnny Drake is an 84 y.o. male who presented to [facility] on 05/31/2018 with a chief complaint of cough and weakness. Treated by PCP for suspected PNA with doxycycline without improvement. Now has 1/4 BCx with GPC, BCID CoNS - likely contaminant.  Name of physician (or Provider) Contacted: Agyei  Current antibiotics: Unasyn + azithromycin (aspiration vs. CA PNA)  Changes to prescribed antibiotics recommended: None - likely contaminant. F/u final culture results. Recommendations accepted by provider.  Results for orders placed or performed during the hospital encounter of 05/31/18  Blood Culture ID Panel (Reflexed) (Collected: 05/31/2018  4:00 PM)  Result Value Ref Range   Enterococcus species NOT DETECTED NOT DETECTED   Listeria monocytogenes NOT DETECTED NOT DETECTED   Staphylococcus species DETECTED (A) NOT DETECTED   Staphylococcus aureus NOT DETECTED NOT DETECTED   Methicillin resistance NOT DETECTED NOT DETECTED   Streptococcus species NOT DETECTED NOT DETECTED   Streptococcus agalactiae NOT DETECTED NOT DETECTED   Streptococcus pneumoniae NOT DETECTED NOT DETECTED   Streptococcus pyogenes NOT DETECTED NOT DETECTED   Acinetobacter baumannii NOT DETECTED NOT DETECTED   Enterobacteriaceae species NOT DETECTED NOT DETECTED   Enterobacter cloacae complex NOT DETECTED NOT DETECTED   Escherichia coli NOT DETECTED NOT DETECTED   Klebsiella oxytoca NOT DETECTED NOT DETECTED   Klebsiella pneumoniae NOT DETECTED NOT DETECTED   Proteus species NOT DETECTED NOT DETECTED   Serratia marcescens NOT DETECTED NOT DETECTED   Haemophilus influenzae NOT DETECTED NOT DETECTED   Neisseria meningitidis NOT DETECTED NOT DETECTED   Pseudomonas aeruginosa NOT DETECTED NOT DETECTED   Candida albicans NOT DETECTED NOT DETECTED   Candida glabrata NOT DETECTED NOT DETECTED   Candida krusei NOT DETECTED NOT  DETECTED   Candida parapsilosis NOT DETECTED NOT DETECTED   Candida tropicalis NOT DETECTED NOT DETECTED    Erin N. Gerarda Fraction, PharmD PGY2 Infectious Diseases Pharmacy Resident Phone: [phone] 06/01/2018  2:06 PM

## 2018-06-01 NOTE — Progress Notes (Signed)
   Subjective: Hospital day 1  Overnight: No acute events reported, afebrile.  Today Mr. Johnny Drake was examined at bedside and reports improvement with cough, shortness of breath and weakness.  He continues to deny fevers, chills, lightheadedness, chest pain, abdominal pain, melena, hematochezia, bright red blood per rectum or hemoptysis.  He states he did not eat breakfast this morning because it was cold.  He has not been able to ambulate since being admitted on the floor.  Objective:  Vital signs in last 24 hours: Vitals:   05/31/18 1745 05/31/18 1800 05/31/18 1913 06/01/18 0048  BP: (!) 141/72 137/88 (!) 124/57 123/78  Pulse: (!) 55 70 61 67  Resp: 14 17 (!) 28   Temp:   97.6 F (36.4 C) 98.3 F (36.8 C)  TempSrc:   Oral Oral  SpO2: 99% 97% 100% 98%  Weight:      Height:       Physical exam Constitutional: No acute distress, lying in bed, appears her stated age Cardiovascular: RRR, no murmurs, gallops, rubs Respiratory: Crackles appreciated at the left lower base posteriorly Abdomen: Soft, Not distended, nontender to palpation Extremity: No pitting edema  Assessment/Plan:  Active Problems:   Pneumonia  Mr. Landfair is an 84 year old Caucasian male with past medical history significant for GERD, hypertension, anemia, aortic valve replacement, hyperlipidemia and EtOH use currently being treated for pneumonia.   Committee acquired pneumonia vs aspiration pneumonia Has remained afebrile.  His vital signs remained stable and with normal [place] blood cell.  1 out of 4 anaerobic blood culture was positive for coag negative staph possibly contaminate and no further changes with antibiotic.  Lung auscultatory exam still noticeable for left lower lobe rales. -Continue azithromycin 500 mg -Continue Zosyn 3 g -Follow-up on next set of blood culture  Anemia, asymptomatic There was concern for upper GI bleeding secondary to either gastritis or peptic ulcer disease given his long history of  dual use of aspirin and celecoxib.  No further reports melena, hematochezia or BRBPR.  Even though his hemoglobin today was measured at 10.5 from 12.6 yesterday, in the setting of aggressive fluid resuscitation with related 3.5L normal saline, this is less likely GI bleeding.  Per chart review his baseline hemoglobin is between 12.1-14.8.  -Continue to follow hemoglobin -We will consult GI if any signs of GI bleeding -Hold aspirin and celecoxib  GERD Today, further elaborated that he ran out of omeprazole for 3 weeks and began experiencing severe heartburn.  Has denied further episodes of heartburn.  He was started on IV Pepcid 20 mg and tonics 40 mg p.o.   Hypertension Blood pressure stable.  We will continue to hold home antihypertensives  Hyperlipidemia Continue fenofibrate 160 mg p.o. daily  EtOH use -On CIWA protocol -Continue thiamine 100 mg daily  ADL -Was evaluated by nutrition and continue to endorse decreased appetite -Will be evaluated by PT/OT    FEN: Replace lytes as needed, thin liquid diet DVT prophylaxis: Lovenox 40 mg SQ CODE STATUS: DNR   Dispo: Anticipated discharge in approximately 2-3 days.    Jean Rosenthal, MD 06/01/2018, 7:03 AM Pager: [phone]

## 2018-06-02 DIAGNOSIS — F10239 Alcohol dependence with withdrawal, unspecified: Secondary | ICD-10-CM

## 2018-06-02 DIAGNOSIS — Z7982 Long term (current) use of aspirin: Secondary | ICD-10-CM

## 2018-06-02 DIAGNOSIS — J690 Pneumonitis due to inhalation of food and vomit: Principal | ICD-10-CM

## 2018-06-02 LAB — CBC
HCT: 33.1 % — ABNORMAL LOW (ref 39.0–52.0)
Hemoglobin: 10.9 g/dL — ABNORMAL LOW (ref 13.0–17.0)
MCH: 32.2 pg (ref 26.0–34.0)
MCHC: 32.9 g/dL (ref 30.0–36.0)
MCV: 97.9 fL (ref 78.0–100.0)
PLATELETS: 128 10*3/uL — AB (ref 150–400)
RBC: 3.38 MIL/uL — ABNORMAL LOW (ref 4.22–5.81)
RDW: 12.1 % (ref 11.5–15.5)
WBC: 6 10*3/uL (ref 4.0–10.5)

## 2018-06-02 LAB — BASIC METABOLIC PANEL
Anion gap: 6 (ref 5–15)
BUN: 21 mg/dL (ref 8–23)
CO2: 22 mmol/L (ref 22–32)
Calcium: 8.5 mg/dL — ABNORMAL LOW (ref 8.9–10.3)
Chloride: 109 mmol/L (ref 98–111)
Creatinine, Ser: 0.98 mg/dL (ref 0.61–1.24)
GFR calc non Af Amer: >60 mL/min (ref >60)
Glucose, Bld: 86 mg/dL (ref 70–99)
Potassium: 4.1 mmol/L (ref 3.5–5.1)
Sodium: 137 mmol/L (ref 135–145)

## 2018-06-02 MED ORDER — SPIRITUS FRUMENTI
1.0000 | Freq: Once | ORAL | Status: DC
  Filled 2018-06-02: qty 1

## 2018-06-02 MED ORDER — FAMOTIDINE 20 MG PO TABS
20.0000 mg | ORAL_TABLET | Freq: Two times a day (BID) | ORAL | Status: DC
  Filled 2018-06-02: qty 1

## 2018-06-02 MED ORDER — OMEPRAZOLE 20 MG PO CPDR: 20.0000 mg | DELAYED_RELEASE_CAPSULE | Freq: Every day | ORAL | 1 refills | Status: AC

## 2018-06-02 MED ORDER — AZITHROMYCIN 250 MG PO TABS: 500.0000 mg | ORAL_TABLET | Freq: Every day | ORAL | Status: DC

## 2018-06-02 MED ORDER — AMOXICILLIN-POT CLAVULANATE 500-125 MG PO TABS: 500.0000 mg | ORAL_TABLET | Freq: Two times a day (BID) | ORAL | 0 refills | Status: AC

## 2018-06-02 NOTE — Care Management Note (Addendum)
Case Management Note  Patient Details  Name: Johnny Drake MRN: [id number] Date of Birth: Feb 17, 1932  Subjective/Objective:  From home indep living at [facility] Will need HHPT/HHOT , will need hh orders to fax to [phone].   NCM faxed [place] orders to [employer] to [facility] for HHPt/HHOT.               Action/Plan: DC home with [facility] services when ready.  Expected Discharge Date:                  Expected Discharge Plan:  [place]  In-House Referral:     Discharge planning Services     Post Acute Care Choice:    Choice offered to:  Patient  DME Arranged:    DME Agency:     HH Arranged:  PT, OT HH Agency:  Other - See comment([facility] at facility)  Status of Service:  Completed, signed off  If discussed at [place] of Stay Meetings, dates discussed:    Additional Comments:  Zenon Mayo, RN 06/02/2018, 1:32 PM

## 2018-06-02 NOTE — Progress Notes (Signed)
OT Note - Addendum    06/02/18 0900  OT Visit Information  Last OT Received On 06/02/18  OT Time Calculation  OT Start Time (ACUTE ONLY) 0853  OT Stop Time (ACUTE ONLY) 0914  OT Time Calculation (min) 21 min  OT General Charges  $OT Visit 1 Visit  OT Evaluation  $OT Eval Moderate Complexity 1 Mod  Maurie Boettcher, OT/L  OT Clinical Specialist [phone]

## 2018-06-02 NOTE — Discharge Summary (Signed)
Name: Johnny Drake MRN: [id number] DOB: [birth year] 84 y.o. PCP: Linus Mako, NP  Date of Admission: 05/31/2018  2:33 PM Date of Discharge:  06/02/2018 Attending Physician: Bartholomew Crews, MD  Discharge Diagnosis: 1.  Aspiration pneumonia 2.  Chronic anemia 3.  GERD 4.  Hypertension 5.  Hyperlipidemia 6.  EtOH use  Discharge Medications: Allergies as of 06/02/2018   No Known Allergies     Medication List    STOP taking these medications   aspirin EC 81 MG tablet   doxycycline 100 MG capsule Commonly known as:  VIBRAMYCIN     TAKE these medications   albuterol 108 (90 Base) MCG/ACT inhaler Commonly known as:  PROVENTIL HFA;VENTOLIN HFA Inhale 2 puffs into the lungs every 6 (six) hours as needed for wheezing or shortness of breath.   amoxicillin-clavulanate 500-125 MG tablet Commonly known as:  AUGMENTIN Take 1 tablet (500 mg total) by mouth 2 (two) times daily.   celecoxib 200 MG capsule Commonly known as:  CELEBREX Take 200 mg by mouth daily.   fenofibrate 145 MG tablet Commonly known as:  TRICOR Take 145 mg by mouth daily.   folic acid 1 MG tablet Commonly known as:  FOLVITE Take 1 mg by mouth daily.   guaiFENesin 600 MG 12 hr tablet Commonly known as:  MUCINEX Take 600 mg by mouth every 12 (twelve) hours. For 7 days   hydrochlorothiazide 12.5 MG tablet Commonly known as:  HYDRODIURIL Take 12.5 mg by mouth daily.   losartan 100 MG tablet Commonly known as:  COZAAR Take 100 mg by mouth daily.   methocarbamol 500 MG tablet Commonly known as:  ROBAXIN Take 500 mg by mouth 2 (two) times daily as needed for muscle spasms.   metoprolol succinate 25 MG 24 hr tablet Commonly known as:  TOPROL-XL Take 25 mg by mouth every other day.   omeprazole 20 MG capsule Commonly known as:  PRILOSEC Take 1 capsule (20 mg total) by mouth daily.   predniSONE 20 MG tablet Commonly known as:  DELTASONE Take 20 mg by mouth See admin instructions. Take  40mg  for 3 days then 20mg  for 4 days   thiamine 100 MG tablet Take 100 mg by mouth daily.       Disposition and follow-up:   Mr.Johnny Drake was discharged from [facility] in [place] condition.  At the hospital follow up visit please address:  1.  Aspiration pneumonia - D/c'd with 5 day course of Augmentin 500mg  BID       ?Gastritis - was taking Aspirin 81mg , Celexa 200mg  daily and EtOH. Discontinued Aspirin on admission(No hx of CAD or Stroke).   2.  Labs / imaging needed at time of follow-up: None  3.  Pending labs/ test needing follow-up: None   Follow-up Appointments: Follow-up [place] Follow up.   Why:  HHPT, HHOT at [facility] information: [place] Hospital Course by problem list: 1. Aspiration Pneumonia  Mr. Johnny Drake is an 84 year old Caucasian male with past medical history significant for GERD, hypertension, anemia, aortic valve replacement and hyperlipidemia who presented to the emergency department via EMS with 1 week history of cough productive of yellow sputum and previously started on doxycycline by her PCP as initial outpatient CXR revealed B/L Pneumonia. He remained afebrile since onset of symptoms. He also had weakness and was found to be hypotensive on admission and  responded appropriately with 3 L normal saline. CBC positive for mild leukocytosis of 11.5, lactic acid normal at 1.74, chest x-ray positive for bilateral apical consolidation.  Blood cultures showed coagulase-negative staph most likely contaminant. Physical exams was noticeable for crackles at the left lower lobe posteriorly and bilateral apices anteriorly. In addition, he has a history of EtOH use and recent episodes of gagging, which put him at risk for aspiration pneumonia. Initially received ceftriaxone 2 g and azithromycin 500 mg.  Ceftriaxone was later discontinued and was started on Unasyn 3g.  Leukocytosis resolved, physical exams  had significantly improved and he was looking better clinically.  He received a total of 2 days of azithromycin 500 mg and 3 days of Unasyn 3 g.  He was discharged with Augmentin 500 mg for 4 days.  2.  Chronic anemia Mr. Johnny Drake has a history of chronic anemia with a baseline hemoglobin between 12.1-13.  During this admission, initial hemoglobin was 12.6 however subsequent CBC showed hemoglobin of 10.5 to be likely dilutional anemia given the aggressive fluid resuscitation.  Hemoglobin remained stable at 10.9 on admission with no further signs or symptoms of upper GI bleed.  3.  GERD Reported of severe heartburn and a day prior to admission due to not been able to refill omeprazole for 2 weeks.  He received IV Pepcid and Protonix with relief of symptoms.  Was discharged with omeprazole 20 mg.  4.  Hypertension Blood pressure remained fairly stable during his admission.  Will discharge on home losartan 100 mg daily, metoprolol 25 mg every other day, hydrochlorothiazide 12.5 mg daily.  5.  Hyperlipidemia Takes fenofibrate 145 p.o. daily  6.  EtOH use Chronic alcohol use.  Reports that he drinks 3 to 4 glasses of vodka daily.  He did not show any signs of alcohol withdrawal during his admission.  Was started on thiamine 100 mg daily   Discharge Vitals:   BP (!) 141/71 (BP Location: Left Arm)   Pulse 61   Temp 98.5 F (36.9 C) (Oral)   Resp 20   Ht 5\' 8"  (1.727 m)   Wt 185 lb (83.9 kg)   SpO2 100%   BMI 28.13 kg/m   Pertinent Labs, Studies, and Procedures:  CBC Latest Ref Rng & Units 06/02/2018 06/01/2018 05/31/2018  WBC 4.0 - 10.5 K/uL 6.0 6.4 11.5(H)  Hemoglobin 13.0 - 17.0 g/dL 10.9(L) 10.5(L) 12.6(L)  Hematocrit 39.0 - 52.0 % 33.1(L) 31.3(L) 37.6(L)  Platelets 150 - 400 K/uL 128(L) 123(L) 239   CMP Latest Ref Rng & Units 06/02/2018 06/01/2018 05/31/2018  Glucose 70 - 99 mg/dL 86 81 98  BUN 8 - 23 mg/dL 21 32(H) 40(H)  Creatinine 0.61 - 1.24 mg/dL 0.98 0.87 0.86  Sodium 135 - 145  mmol/L 137 137 138  Potassium 3.5 - 5.1 mmol/L 4.1 4.8 3.9  Chloride 98 - 111 mmol/L 109 111 109  CO2 22 - 32 mmol/L 22 19(L) 20(L)  Calcium 8.9 - 10.3 mg/dL 8.5(L) 7.4(L) 7.2(L)  Total Protein 6.5 - 8.1 g/dL - 4.8(L) -  Total Bilirubin 0.3 - 1.2 mg/dL - 1.2 -  Alkaline Phos 38 - 126 U/L - 28(L) -  AST 15 - 41 U/L - 41 -  ALT 0 - 44 U/L - 23 -   Blood Culture    Component Value Date/Time   SDES BLOOD SITE NOT SPECIFIED 05/31/2018 1600   SPECREQUEST  05/31/2018 1600    BOTTLES DRAWN AEROBIC AND ANAEROBIC Blood Culture adequate volume  CULT STAPHYLOCOCCUS SPECIES (COAGULASE NEGATIVE) (A) 05/31/2018 1600   REPTSTATUS PENDING 05/31/2018 1600    Discharge Instructions: Discharge Instructions    Call MD for:  difficulty breathing, headache or visual disturbances   Complete by:  As directed    Call MD for:  extreme fatigue   Complete by:  As directed    Call MD for:  hives   Complete by:  As directed    Call MD for:  persistant dizziness or light-headedness   Complete by:  As directed    Call MD for:  persistant nausea and vomiting   Complete by:  As directed    Call MD for:  redness, tenderness, or signs of infection (pain, swelling, redness, odor or green/yellow discharge around incision site)   Complete by:  As directed    Call MD for:  severe uncontrolled pain   Complete by:  As directed    Call MD for:  temperature >100.4   Complete by:  As directed    Diet - low sodium heart healthy   Complete by:  As directed    Discharge instructions   Complete by:  As directed    Mr. Kestler,   It was a pleasure taking care of you at the hospital. You were treated for pneumonia and received IV antibiotics. I am discharging you with 4 days of additional oral antibiotics (Augmentin).   Also, I stopped your Aspirin so you will only take the Celexa.   Dr. Eileen Stanford   Increase activity slowly   Complete by:  As directed       Signed: Jean Rosenthal, MD 06/02/2018, 3:34 PM   Pager:  [phone]

## 2018-06-02 NOTE — Progress Notes (Signed)
Occupational Therapy Evaluation Patient Details Name: Johnny Drake MRN: [id number] DOB: 21-Jul-1932 Today's Date: 06/02/2018    History of Present Illness Pt is 84 yo male who was being treated for CAP and his symptoms worsened bringing him to ED. They included productive cough, fever, chills, shortness of breath, generalized weakness, and off balance. He has also had worsening of GERD symptoms and has been off his meds for this for several weeks.    Clinical Impression   PTA, pt was living at assisted living facilty with wife, and was independent with ADLs and functional mobility. PTA, pt was driving and active in his community, participating in senior Olympics. Pt currently requires minguard during functional mobility and with ADLs. During functional mobility pt had increased wob, DoE 3/4, maintained Spo2 >95% during session. Due to deficits listed below (see OT problem list), pt would benefit from acute OT to address establish goals to facilitate safe D/C home. At this time, recommend HHOT follow-up.      Follow Up Recommendations  Home health OT;Supervision/Assistance - 24 hour    Equipment Recommendations  None recommended by OT    Recommendations for Other Services PT consult     Precautions / Restrictions Precautions Precautions: Fall Precaution Comments: came in with balance issues Restrictions Weight Bearing Restrictions: No      Mobility Bed Mobility Overal bed mobility: Independent                Transfers Overall transfer level: Needs assistance Equipment used: None Transfers: Sit to/from Stand Sit to Stand: Min guard         General transfer comment: minguard for initial stability, minor instabiliyt, pt able to self adjust    Balance Overall balance assessment: Needs assistance Sitting-balance support: No upper extremity supported;Feet supported Sitting balance-Leahy Scale: Good     Standing balance support: No upper extremity supported;During  functional activity Standing balance-Leahy Scale: Fair Standing balance comment: pt furniture walked, required external support for increased stability                           ADL either performed or assessed with clinical judgement   ADL Overall ADL's : Needs assistance/impaired Eating/Feeding: Independent   Grooming: Min guard;Standing   Upper Body Bathing: Min guard;Standing   Lower Body Bathing: Min guard;Sit to/from stand   Upper Body Dressing : Min guard;Standing   Lower Body Dressing: Min guard Lower Body Dressing Details (indicate cue type and reason): pt able to reach socks sitting EOB Toilet Transfer: Ambulation;Min guard Toilet Transfer Details (indicate cue type and reason): simulated from EOB ambulating to recliner Toileting- Clothing Manipulation and Hygiene: Min guard       Functional mobility during ADLs: Min guard General ADL Comments: pt ambulated in room at min guard without use of DME;     Vision Baseline Vision/History: Wears glasses Wears Glasses: Reading only Patient Visual Report: No change from baseline       Perception     Praxis      Pertinent Vitals/Pain Pain Assessment: No/denies pain     Hand Dominance Right   Extremity/Trunk Assessment Upper Extremity Assessment Upper Extremity Assessment: Overall WFL for tasks assessed   Lower Extremity Assessment Lower Extremity Assessment: Defer to PT evaluation   Cervical / Trunk Assessment Cervical / Trunk Assessment: Kyphotic   Communication Communication Communication: No difficulties   Cognition Arousal/Alertness: Awake/alert Behavior During Therapy: WFL for tasks assessed/performed Overall Cognitive Status: Within Functional Limits for  tasks assessed                                     General Comments  maintained spO2 >95 RA during session;increased WOB with functional mobility DoE 3/4    Exercises     Shoulder Instructions      Home Living  Family/patient expects to be discharged to:: Private residence Living Arrangements: Spouse/significant other Available Help at Discharge: Family;Available 24 hours/day Type of Home: Independent living facility Home Access: Level entry     Home Layout: One level     Bathroom Shower/Tub: Occupational psychologist: Handicapped height Bathroom Accessibility: Yes How Accessible: Accessible via walker Home Equipment: [place] - 2 wheels;Walker - 4 wheels;Grab bars - toilet;Grab bars - tub/shower   Additional Comments: pt's wife ambulates with RW and he is very worried about her. His living situation is 3 level care;wife has walkin shower with shower chair;pt reports his shower is not big enough to fit any DME and reports its even too small for a possible fall      Prior Functioning/Environment Level of Independence: Independent        Comments: drives, participates in senior olympics for swimming;pt reports no difficulty with medication management;pt was active in his community and reports weakness and SOB only started with pneumonia        OT Problem List: Decreased activity tolerance;Impaired balance (sitting and/or standing);Decreased safety awareness;Cardiopulmonary status limiting activity      OT Treatment/Interventions: Self-care/ADL training;Therapeutic exercise;Energy conservation;DME and/or AE instruction;Therapeutic activities;Balance training;Patient/family education    OT Goals(Current goals can be found in the care plan section) Acute Rehab OT Goals Patient Stated Goal: get back home with wife and friends OT Goal Formulation: With patient Time For Goal Achievement: 06/16/18 Potential to Achieve Goals: Good  OT Frequency: Min 2X/week   Barriers to D/C:            Co-evaluation              AM-PAC PT "6 Clicks" Daily Activity     Outcome Measure Help from another person eating meals?: None Help from another person taking care of personal grooming?:  A Little Help from another person toileting, which includes using toliet, bedpan, or urinal?: A Little Help from another person bathing (including washing, rinsing, drying)?: A Little Help from another person to put on and taking off regular upper body clothing?: A Little Help from another person to put on and taking off regular lower body clothing?: A Little 6 Click Score: 19   End of Session Equipment Utilized During Treatment: Gait belt Nurse Communication: Mobility status  Activity Tolerance: Patient tolerated treatment well Patient left: in chair;with call bell/phone within reach;with nursing/sitter in room  OT Visit Diagnosis: Unsteadiness on feet (R26.81);Other abnormalities of gait and mobility (R26.89);Muscle weakness (generalized) (M62.81)                Time: 2094-7096 OT Time Calculation (min): 21 min   Dorinda Hill OTS    Dorinda Hill 06/02/2018, 9:31 AM

## 2018-06-02 NOTE — Progress Notes (Signed)
Discharge instructions given and understanding verbalized.  Prescriptions given and taken with pt for discharge.  Assisted to finish dressing and called for taxi home for pt.

## 2018-06-02 NOTE — Progress Notes (Signed)
   Subjective: Hospital day 2  Overnight: No events reported  Today Mr. Gershman was examined at bedside.  He denied any complaints during assessment and reported of resolving shortness of breath and cough.  He has a good appetite but was not able to eat breakfast because it was cold.  In addition, he denies chest pain, palpitation, headache, lightheadedness, abdominal pain or nausea.  He has been able to work with physical therapy since admission and has tolerated each session very well.  Objective:  Vital signs in last 24 hours: Vitals:   06/01/18 0813 06/01/18 1721 06/02/18 0014 06/02/18 0809  BP: (!) 114/51 134/77 132/71 (!) 141/71  Pulse: 79 70 61 61  Resp: (!) 24 (!) 24 18 20   Temp: 98.3 F (36.8 C) 98.9 F (37.2 C) 99.4 F (37.4 C) 98.5 F (36.9 C)  TempSrc: Oral Oral Oral Oral  SpO2: 100% 99% 98% 100%  Weight:      Height:       Physical exam Constitutional: In no acute distress Respiratory: CTA BL, no crackles, wheezes, rhonchi Cardiovascular: RRR, no murmurs, gallops, rubs Abdomen: BS+, nontender to palpation.  Assessment/Plan:  Active Problems:   Community acquired pneumonia of right upper lobe of lung ([place])   Pneumonia  Mr. Rakes is an 84 year old Asian male with past medical history significant for GERD, hypertension, anemia, aortic valve replacement, hyperlipidemia and EtOH use, being treated for aspiration pneumonia.  He has remained afebrile for the past 48 hours with resolution of cough and improvement on lung auscultation.    Aspiration pneumonia Afebrile for 48hrs with stable vitals and WBC.  Cough and shortness of breath resolved.  Lung exams this morning was unremarkable.  He was started on IV Unasyn 3 g yesterday and azithromycin 500 mg was continued.  -Blood culture NGTD x 2 days  -He will be safe for discharge with p.o. Augmentin 500 mg for total of 4 days.  Chronic anemia, asymptomatic He remains asymptomatic and does not report any signs of GI  bleed, hemoglobin has been stable and mildly uptrending at 10.9 today from 10.5 yesterday.  He has a long-standing use of NSAIDs (aspirin and Celexa) and EtOH use which puts him at risk for gastric irritation.  At this point, I do not see any indication of continue aspirin since he is past the 21-month duration of antiplatelet recommendation after bioprosthetic procedure. -Will discontinue aspirin at discharge  GERD Well-controlled with IV Pepcid 20 mg and Protonix 40 mg. -Will discharge with home omeprazole  Hypertension Stable BP  Hyperlipidemia Continue fenofibrate 160 mg p.o. daily  EtOH use Assessment today, there was concern for early alcohol withdrawal as his mood had changed.  He was given a one-time order of [place]. He usually drinks 3 to 4 glasses of vodka and his last drink was 3 days ago.  ADL PT/OT recommended home health.  He lives at an assisted living facility [facility] living) and is safe to return. An order for home health has been placed.   FEN: Replace lytes as needed, thin liquid diet DVT prophylaxis: Lovenox 40 mg SQ CODE STATUS: DNR   Dispo: Anticipated discharge in approximately today if blood cx negative.    Jean Rosenthal, MD 06/02/2018, 11:49 AM Pager: [phone]

## 2018-06-02 NOTE — Progress Notes (Signed)
  Date: 06/02/2018  Patient name: Johnny Drake  Medical record number: [id number]  Date of birth: 01/10/1932   I have seen and evaluated this patient and I have discussed the plan of care with the house staff. Please see their note for complete details. I concur with their findings with the following additions/corrections: Mildred Bollard was seen on AM rounds with team.   Mr. Depaulo continues to feel better today.  He remains afebrile with no other abnormalities in his vital signs.  Blood cultures remain negative, 104 were positive for gram-positive cocci likely a contaminant.  He did exhibit irritability today but no other signs of withdrawal.  We are very concerned for withdrawal and I see no reason to keep him here in the hospital, ensuring further withdrawal, and therefore will discharge him to home on oral antibiotics and he may resume his daily alcohol intake.  He has no history of stroke or CAD and takes an aspirin because "he was told to".  He is at high risk for gastritis and ulcers - daily ETOH, ASA, and a Cox 2 inhibitor. COX 2, when combined with ASA, do not have a lower GI toxicity compared to NSAID.  Therefore, we will recommend that he stop his aspirin to lower the risk of GI complications and so that he gains the protection of the Cox 2 inhibitor compared to nonsteroidals.  Bartholomew Crews, MD 06/02/2018, 3:01 PM

## 2018-06-02 NOTE — Progress Notes (Signed)
Discharged via [place] by NT for home via [facility].  No s/s of distress at this time.  Respirations even and unlabored.

## 2018-06-03 LAB — CULTURE, BLOOD (ROUTINE X 2): Special Requests: ADEQUATE

## 2018-06-03 NOTE — NC FL2 (Signed)
[place] LEVEL OF CARE SCREENING TOOL     IDENTIFICATION  Patient Name: Johnny Drake Birthdate: [birth year] Sex: male Admission Date (Current Location): 05/31/2018  [facility] and [place] Number:  Herbalist and Address:  [facility] [place] [place] [address], [place], [place] [address]      Provider Number: [id number]  Attending Physician Name and Address:  No att. providers found  Relative Name and Phone Number:       Current Level of Care: Hospital Recommended Level of Care: [place] Prior Approval Number:    Date Approved/Denied:   PASRR Number: [id number]  Discharge Plan: SNF    Current Diagnoses: Patient Active Problem List   Diagnosis Date Noted  . Pneumonia 06/01/2018  . Community acquired pneumonia of right upper lobe of lung ([place]) 05/31/2018    Orientation RESPIRATION BLADDER Height & Weight     Self, Time, Situation, Place  Normal Continent Weight: 185 lb (83.9 kg) Height:  5\' 8"  (172.7 cm)  BEHAVIORAL SYMPTOMS/MOOD NEUROLOGICAL BOWEL NUTRITION STATUS      Continent Diet(low sodium heart healthy)  AMBULATORY STATUS COMMUNICATION OF NEEDS Skin   Limited Assist Verbally Normal                       Personal Care Assistance Level of Assistance  Bathing, Dressing Bathing Assistance: Limited assistance   Dressing Assistance: Limited assistance     Functional Limitations Info             SPECIAL CARE FACTORS FREQUENCY  PT (By licensed PT), OT (By licensed OT)     PT Frequency: 5/wk OT Frequency: 5/wk            Contractures      Additional Factors Info  Code Status, Allergies Code Status Info: DNR Allergies Info: NKA           Current Medications (06/03/2018):  This is the current hospital active medication list No current facility-administered medications for this encounter.    Current Outpatient Medications  Medication Sig Dispense Refill  . albuterol (PROVENTIL  HFA;VENTOLIN HFA) 108 (90 Base) MCG/ACT inhaler Inhale 2 puffs into the lungs every 6 (six) hours as needed for wheezing or shortness of breath.    . celecoxib (CELEBREX) 200 MG capsule Take 200 mg by mouth daily.    . fenofibrate (TRICOR) 145 MG tablet Take 145 mg by mouth daily.    . folic acid (FOLVITE) 1 MG tablet Take 1 mg by mouth daily.    Marland Kitchen guaiFENesin (MUCINEX) 600 MG 12 hr tablet Take 600 mg by mouth every 12 (twelve) hours. For 7 days    . hydrochlorothiazide (HYDRODIURIL) 12.5 MG tablet Take 12.5 mg by mouth daily.    Marland Kitchen losartan (COZAAR) 100 MG tablet Take 100 mg by mouth daily.    . methocarbamol (ROBAXIN) 500 MG tablet Take 500 mg by mouth 2 (two) times daily as needed for muscle spasms.    . metoprolol succinate (TOPROL-XL) 25 MG 24 hr tablet Take 25 mg by mouth every other day.    . predniSONE (DELTASONE) 20 MG tablet Take 20 mg by mouth See admin instructions. Take 40mg  for 3 days then 20mg  for 4 days    . thiamine 100 MG tablet Take 100 mg by mouth daily.    Marland Kitchen amoxicillin-clavulanate (AUGMENTIN) 500-125 MG tablet Take 1 tablet (500 mg total) by mouth 2 (two) times daily. 8 tablet 0  . omeprazole (  PRILOSEC) 20 MG capsule Take 1 capsule (20 mg total) by mouth daily. 30 capsule 1     Discharge Medications: Please see discharge summary for a list of discharge medications.  Relevant Imaging Results:  Relevant Lab Results:   Additional Information SS#: [id number]  Jorge Ny, LCSW

## 2018-06-05 LAB — CULTURE, BLOOD (ROUTINE X 2)
CULTURE: NO GROWTH
SPECIAL REQUESTS: ADEQUATE

## 2019-01-07 ENCOUNTER — Encounter (HOSPITAL_BASED_OUTPATIENT_CLINIC_OR_DEPARTMENT_OTHER): Payer: Self-pay

## 2019-01-07 ENCOUNTER — Other Ambulatory Visit: Payer: Self-pay

## 2019-01-07 ENCOUNTER — Emergency Department (HOSPITAL_BASED_OUTPATIENT_CLINIC_OR_DEPARTMENT_OTHER)
Admission: EM | Admit: 2019-01-07 | Discharge: 2019-01-07 | Disposition: A | Discharge status: Home / Self Care | Payer: Medicare Other | Attending: Emergency Medicine | Admitting: Emergency Medicine

## 2019-01-07 ENCOUNTER — Emergency Department (HOSPITAL_BASED_OUTPATIENT_CLINIC_OR_DEPARTMENT_OTHER): Payer: Medicare Other

## 2019-01-07 DIAGNOSIS — Z952 Presence of prosthetic heart valve: Secondary | ICD-10-CM | POA: Insufficient documentation

## 2019-01-07 DIAGNOSIS — Y929 Unspecified place or not applicable: Secondary | ICD-10-CM | POA: Insufficient documentation

## 2019-01-07 DIAGNOSIS — Y9389 Activity, other specified: Secondary | ICD-10-CM | POA: Insufficient documentation

## 2019-01-07 DIAGNOSIS — S0081XA Abrasion of other part of head, initial encounter: Secondary | ICD-10-CM | POA: Diagnosis not present

## 2019-01-07 DIAGNOSIS — Y999 Unspecified external cause status: Secondary | ICD-10-CM | POA: Insufficient documentation

## 2019-01-07 DIAGNOSIS — S0990XA Unspecified injury of head, initial encounter: Secondary | ICD-10-CM | POA: Diagnosis present

## 2019-01-07 DIAGNOSIS — Z85828 Personal history of other malignant neoplasm of skin: Secondary | ICD-10-CM | POA: Insufficient documentation

## 2019-01-07 DIAGNOSIS — S51012A Laceration without foreign body of left elbow, initial encounter: Secondary | ICD-10-CM | POA: Diagnosis not present

## 2019-01-07 DIAGNOSIS — W010XXA Fall on same level from slipping, tripping and stumbling without subsequent striking against object, initial encounter: Secondary | ICD-10-CM | POA: Insufficient documentation

## 2019-01-07 DIAGNOSIS — I10 Essential (primary) hypertension: Secondary | ICD-10-CM | POA: Diagnosis not present

## 2019-01-07 DIAGNOSIS — Z79899 Other long term (current) drug therapy: Secondary | ICD-10-CM | POA: Insufficient documentation

## 2019-01-07 HISTORY — DX: Spondylosis without myelopathy or radiculopathy, lumbar region: M47.816

## 2019-01-07 HISTORY — DX: Anemia, unspecified: D64.9

## 2019-01-07 HISTORY — DX: Essential (primary) hypertension: I10

## 2019-01-07 HISTORY — DX: Hyperlipidemia, unspecified: E78.5

## 2019-01-07 HISTORY — DX: Basal cell carcinoma of skin, unspecified: C44.91

## 2019-01-07 NOTE — ED Triage Notes (Signed)
Pt presents via [facility] from [facility]- pt had witnessed fall when approaching dog. Abrasions to left elbow- lip- head. No blood thinners. No LOC. Pt a/o x 4.

## 2019-01-07 NOTE — ED Provider Notes (Signed)
[facility] EMERGENCY DEPARTMENT Provider Note   CSN: [id number] Arrival date & time: 01/07/19  1507    History   Chief Complaint Chief Complaint  Patient presents with  . Fall    HPI Johnny Drake is a 85 y.o. male.     85 yo M with a chief complaint of a fall.  Patient states that he went to pet a dog and the leash got wrapped around his legs and he lost his balance and fell.  Landed on his head, left elbow and left knee.  Ambulatory after the event.  Complaining mostly of a split lip.  Denies confusion denies severe headache denies vomiting.  Denies blood thinner use.  Able to move his arms and legs without difficulty.  The history is provided by the patient.  Fall  This is a new problem. The current episode started less than 1 hour ago. The problem occurs rarely. The problem has not changed since onset.Pertinent negatives include no chest pain, no abdominal pain, no headaches and no shortness of breath. Nothing aggravates the symptoms. Nothing relieves the symptoms. He has tried nothing for the symptoms. The treatment provided no relief.    Past Medical History:  Diagnosis Date  . Anemia   . Basal cell carcinoma    face  . Hyperlipemia   . Hypertension   . Lumbar spondylosis   . Smoking history     Patient Active Problem List   Diagnosis Date Noted  . Pneumonia 06/01/2018  . Community acquired pneumonia of right upper lobe of lung ([place]) 05/31/2018    Past Surgical History:  Procedure Laterality Date  . AORTIC VALVE REPLACEMENT          Home Medications    Prior to Admission medications   Medication Sig Start Date End Date Taking? Authorizing Provider  albuterol (PROVENTIL HFA;VENTOLIN HFA) 108 (90 Base) MCG/ACT inhaler Inhale 2 puffs into the lungs every 6 (six) hours as needed for wheezing or shortness of breath.    [provider]  amoxicillin-clavulanate (AUGMENTIN) 500-125 MG tablet Take 1 tablet (500 mg total) by mouth 2 (two) times  daily. 06/02/18   Jean Rosenthal, MD  celecoxib (CELEBREX) 200 MG capsule Take 200 mg by mouth daily.    [provider]  fenofibrate (TRICOR) 145 MG tablet Take 145 mg by mouth daily.    [provider]  folic acid (FOLVITE) 1 MG tablet Take 1 mg by mouth daily.    [provider]  guaiFENesin (MUCINEX) 600 MG 12 hr tablet Take 600 mg by mouth every 12 (twelve) hours. For 7 days    [provider]  hydrochlorothiazide (HYDRODIURIL) 12.5 MG tablet Take 12.5 mg by mouth daily.    [provider]  losartan (COZAAR) 100 MG tablet Take 100 mg by mouth daily.    [provider]  methocarbamol (ROBAXIN) 500 MG tablet Take 500 mg by mouth 2 (two) times daily as needed for muscle spasms.    [provider]  metoprolol succinate (TOPROL-XL) 25 MG 24 hr tablet Take 25 mg by mouth every other day.    [provider]  omeprazole (PRILOSEC) 20 MG capsule Take 1 capsule (20 mg total) by mouth daily. 06/02/18   Jean Rosenthal, MD  predniSONE (DELTASONE) 20 MG tablet Take 20 mg by mouth See admin instructions. Take 40mg  for 3 days then 20mg  for 4 days    [provider]  thiamine 100 MG tablet Take 100 mg  by mouth daily.    [provider]    Family History No family history on file.  Social History Social History   Tobacco Use  . Smoking status: Never Smoker  . Smokeless tobacco: Never Used  Substance Use Topics  . Alcohol use: Not Currently  . Drug use: Not Currently     Allergies   Patient has no known allergies.   Review of Systems Review of Systems  Constitutional: Negative for chills and fever.  HENT: Negative for congestion and facial swelling.   Eyes: Negative for discharge and visual disturbance.  Respiratory: Negative for shortness of breath.   Cardiovascular: Negative for chest pain and palpitations.  Gastrointestinal: Negative for abdominal pain, diarrhea and vomiting.  Musculoskeletal: Negative  for arthralgias and myalgias.  Skin: Positive for wound. Negative for color change and rash.  Neurological: Negative for tremors, syncope and headaches.  Psychiatric/Behavioral: Negative for confusion and dysphoric mood.     Physical Exam Updated Vital Signs BP (!) 150/78 (BP Location: Right Arm)   Pulse 88   Temp 98.3 F (36.8 C) (Oral)   Resp 20   Ht 5\' 8"  (1.727 m)   Wt 84.8 kg   SpO2 98%   BMI 28.43 kg/m   Physical Exam Vitals signs and nursing note reviewed.  Constitutional:      Appearance: He is well-developed.  HENT:     Head: Normocephalic and atraumatic.     Comments: Abrasion to the left forehead    Mouth/Throat:     Comments: Superficial avulsion to the upper mid lip Eyes:     Pupils: Pupils are equal, round, and reactive to light.  Neck:     Musculoskeletal: Normal range of motion and neck supple.     Vascular: No JVD.  Cardiovascular:     Rate and Rhythm: Normal rate and regular rhythm.     Heart sounds: No murmur. No friction rub. No gallop.   Pulmonary:     Effort: No respiratory distress.     Breath sounds: No wheezing.  Abdominal:     General: There is no distension.     Tenderness: There is no guarding or rebound.  Musculoskeletal: Normal range of motion.  Skin:    Coloration: Skin is not pale.     Findings: No rash.     Comments: Small skin tear to the left elbow.    Neurological:     Mental Status: He is alert and oriented to person, place, and time.  Psychiatric:        Behavior: Behavior normal.      ED Treatments / Results  Labs (all labs ordered are listed, but only abnormal results are displayed) Labs Reviewed - No data to display  EKG None  Radiology Ct Head Wo Contrast  Result Date: 01/07/2019 CLINICAL DATA:  Head trauma.  Hit left side of head, fall. EXAM: CT HEAD WITHOUT CONTRAST TECHNIQUE: Contiguous axial images were obtained from the base of the skull through the vertex without intravenous contrast. COMPARISON:  MRI  11/01/2017 FINDINGS: Brain: Mild age related volume loss. No acute intracranial abnormality. Specifically, no hemorrhage, hydrocephalus, mass lesion, acute infarction, or significant intracranial injury. Vascular: No hyperdense vessel or unexpected calcification. Skull: No acute calvarial abnormality. Sinuses/Orbits: Visualized paranasal sinuses and mastoids clear. Orbital soft tissues unremarkable. Other: None IMPRESSION: No acute intracranial abnormality. Electronically Signed   By: Rolm Baptise M.D.   On: 01/07/2019 15:41    Procedures Procedures (including critical care time)  Medications  Ordered in ED Medications - No data to display   Initial Impression / Assessment and Plan / ED Course  I have reviewed the triage vital signs and the nursing notes.  Pertinent labs & imaging results that were available during my care of the patient were reviewed by me and considered in my medical decision making (see chart for details).        85 yo M with a chief complaint of a fall.  Mechanical by nature.  Last tetanus was in 2014.  Discussed risks and benefits of CT of the head.  Will obtain a CT.  No bony tenderness able to ambulate without difficulty.  CT of the head is negative.  Discharge patient home.  4:06 PM:  I have discussed the diagnosis/risks/treatment options with the patient and believe the pt to be eligible for discharge home to follow-up with PCP. We also discussed returning to the ED immediately if new or worsening sx occur. We discussed the sx which are most concerning (e.g., sudden worsening pain, fever, inability to tolerate by mouth) that necessitate immediate return. Medications administered to the patient during their visit and any new prescriptions provided to the patient are listed below.  Medications given during this visit Medications - No data to display   The patient appears reasonably screen and/or stabilized for discharge and I doubt any other medical condition or  other [facility] requiring further screening, evaluation, or treatment in the ED at this time prior to discharge.    Final Clinical Impressions(s) / ED Diagnoses   Final diagnoses:  Minor head injury, initial encounter  Skin tear of left elbow without complication, initial encounter    ED Discharge Orders    None       Deno Etienne, DO 01/07/19 1606

## 2019-01-07 NOTE — ED Notes (Signed)
Pt awaiting ride from facility

## 2019-08-06 IMAGING — CT CT HEAD W/O CM
3 series · 16 of 47 positions shown, 19 images · non-contrast
Comparison: MRI 11/01/2017

CLINICAL DATA: Head trauma.  Hit left side of head, fall.

EXAM:
CT HEAD WITHOUT CONTRAST
TECHNIQUE: Contiguous axial images were obtained from the base of the skull
through the vertex without intravenous contrast.

[Series 2: head wo · axial · 0.47mm/px · z∈[+942,+1082]mm · 10 of 34 slices shown, 13 images]
[im 3/34  brain]
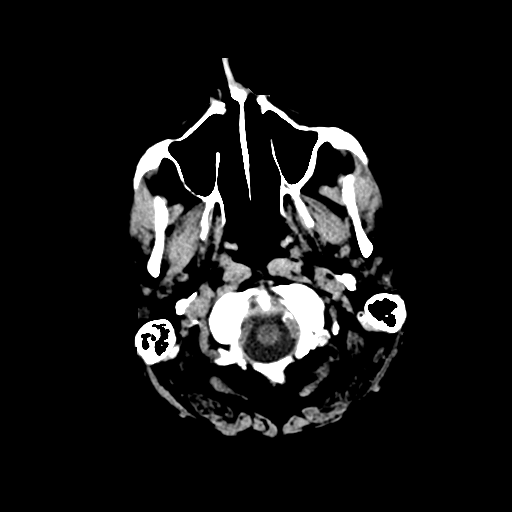
[im 3/34  bone]
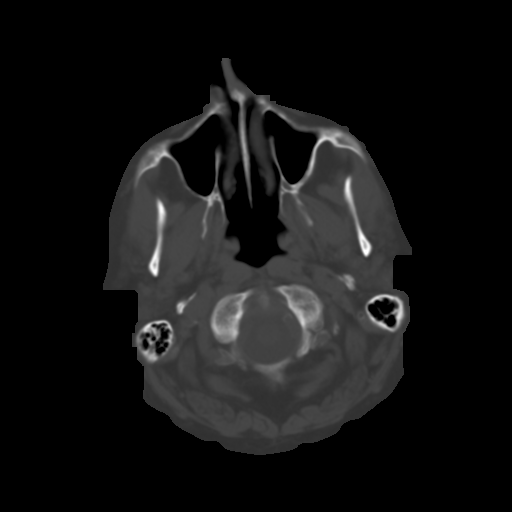
[im 6/34  brain]
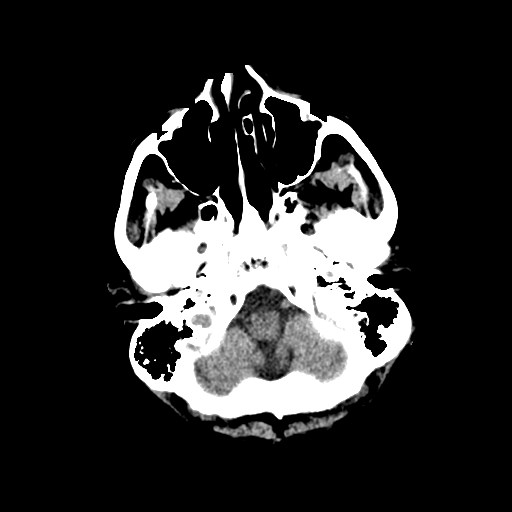
[im 10/34  brain]
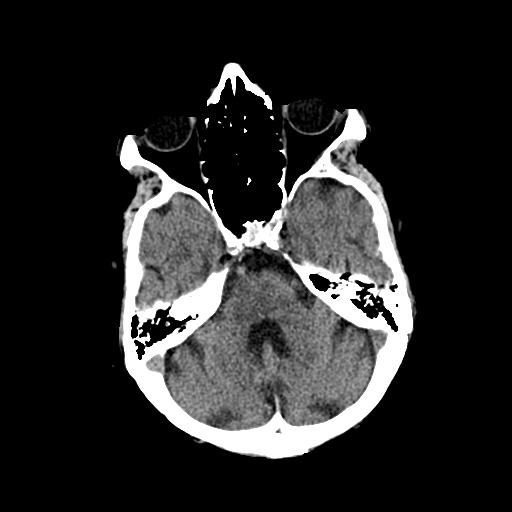
[im 12/34  brain]
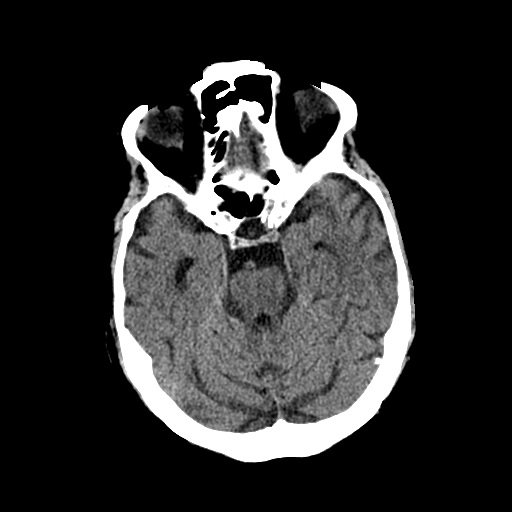
[im 15/34  brain]
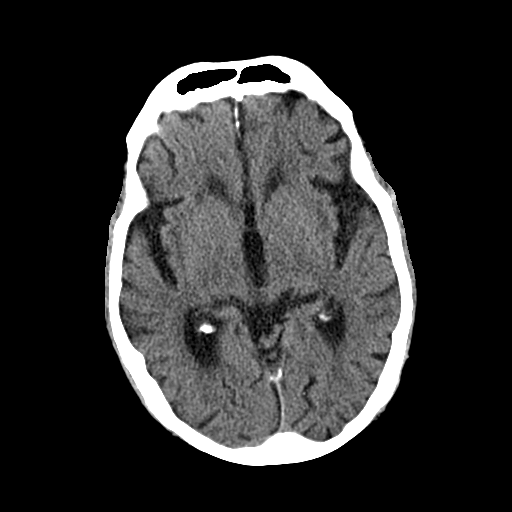
[im 15/34  bone]
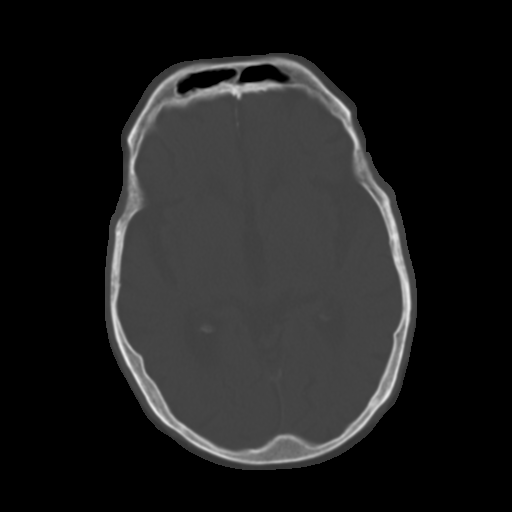
[im 19/34  brain]
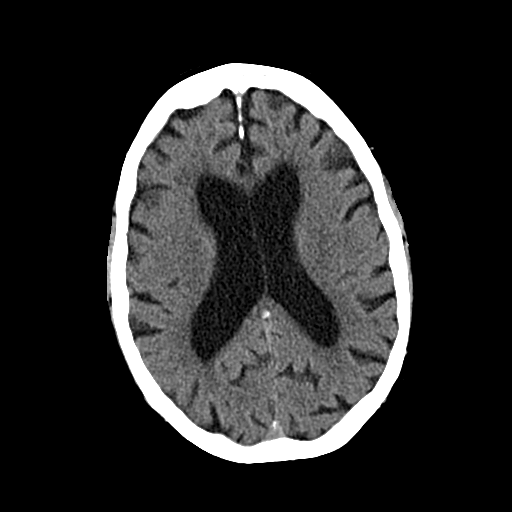
[im 22/34  brain]
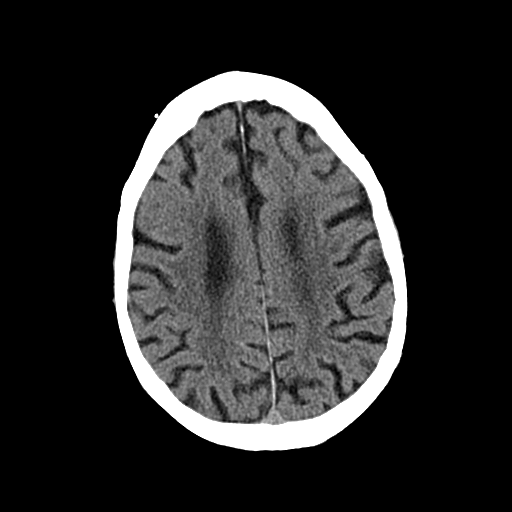
[im 26/34  brain]
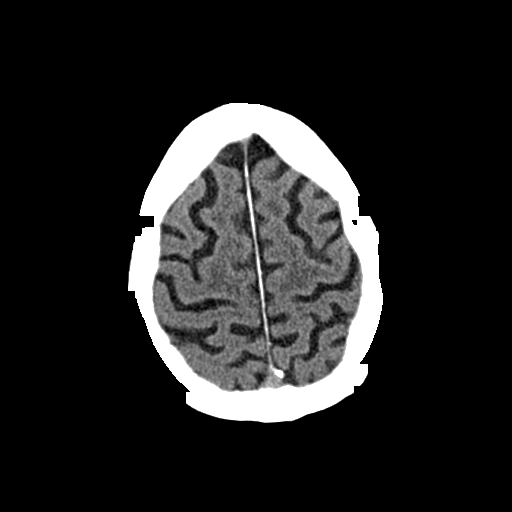
[im 28/34  brain]
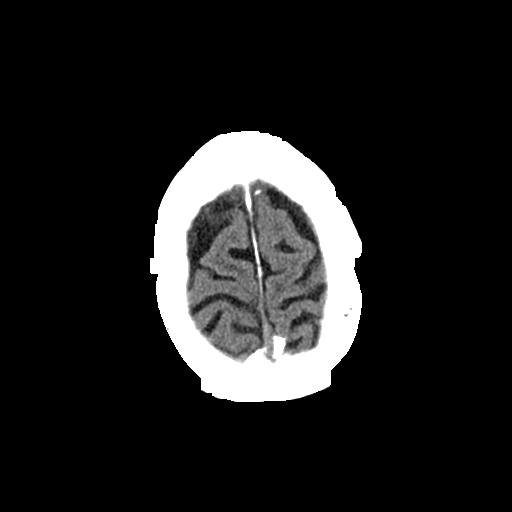
[im 28/34  bone]
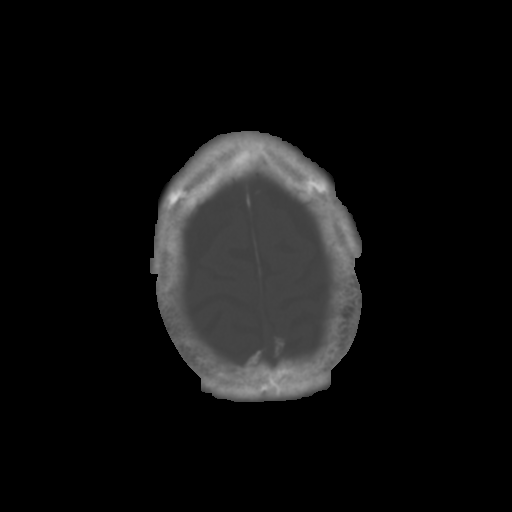
[im 31/34  brain]
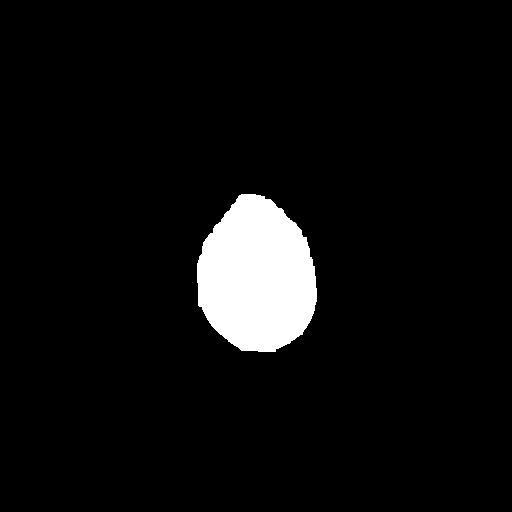

[Series 4: cor soft · coronal · 0.34mm/px · 3 of 73 slices shown]
[im 25/73  brain]
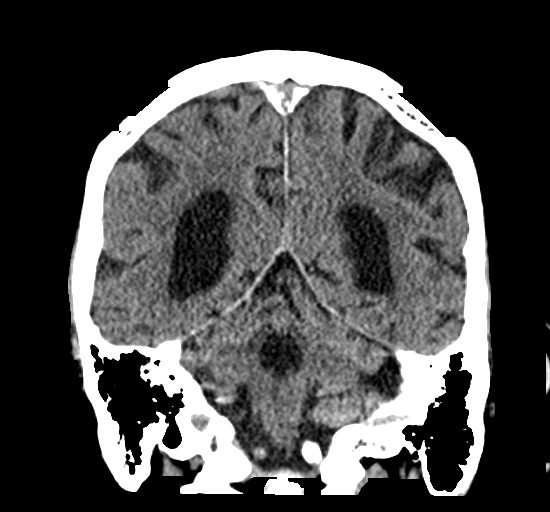
[im 33/73  brain]
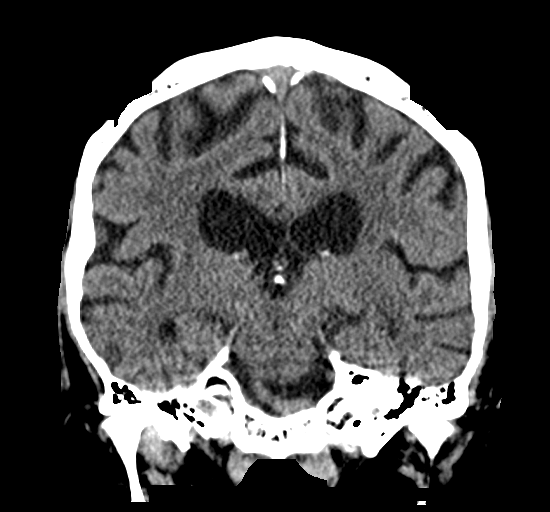
[im 41/73  brain]
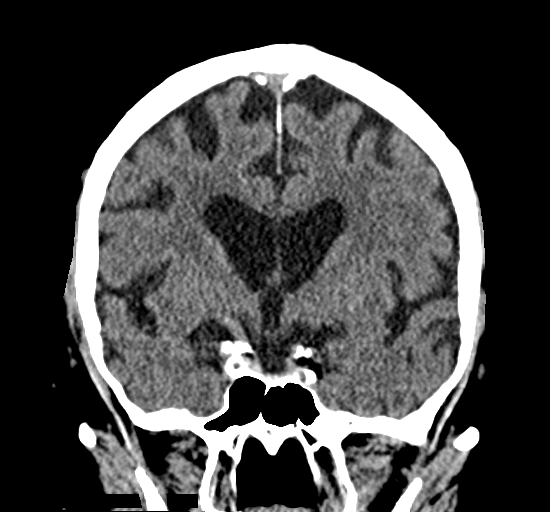

[Series 5: sag soft · sagittal · 0.34mm/px · 3 of 54 slices shown]
[im 18/54  brain]
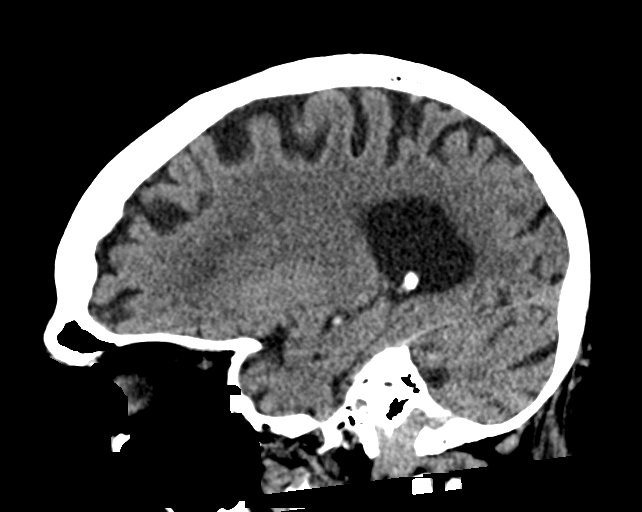
[im 27/54  brain]
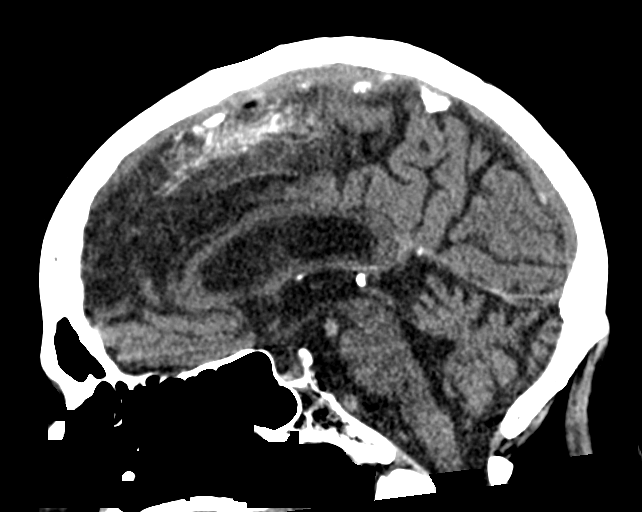
[im 36/54  brain]
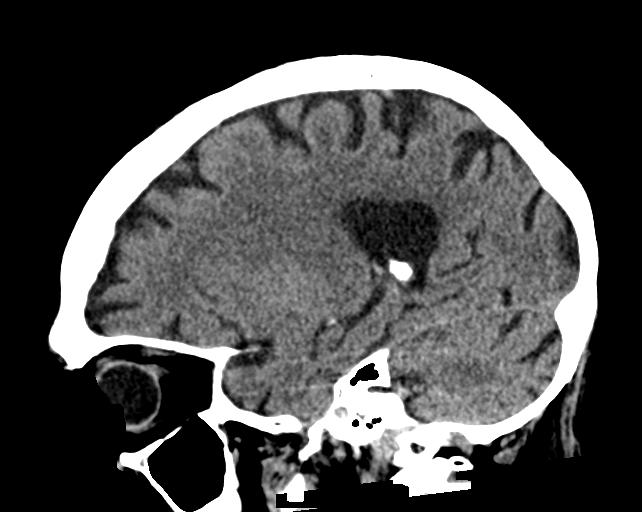

[16 of 47 positions shown; findings below may reference images not displayed]

FINDINGS: Brain: Mild age related volume loss. No acute intracranial
abnormality. Specifically, no hemorrhage, hydrocephalus, mass
lesion, acute infarction, or significant intracranial injury.

Vascular: No hyperdense vessel or unexpected calcification.

Skull: No acute calvarial abnormality.

Sinuses/Orbits: Visualized paranasal sinuses and mastoids clear.
Orbital soft tissues unremarkable.

Other: None
IMPRESSION: No acute intracranial abnormality.

## 2019-11-11 DEATH — deceased
